# Patient Record
Sex: Female | Born: 1980
Health system: Southern US, Community
[De-identification: ages and names within clinical notes are randomized; demographics above are authoritative.]

## PROBLEM LIST (undated history)

## (undated) DIAGNOSIS — K219 Gastro-esophageal reflux disease without esophagitis: Secondary | ICD-10-CM

## (undated) DIAGNOSIS — E785 Hyperlipidemia, unspecified: Secondary | ICD-10-CM

## (undated) HISTORY — DX: Hyperlipidemia, unspecified: E78.5

---

## 1998-07-18 ENCOUNTER — Ambulatory Visit (HOSPITAL_COMMUNITY): Admission: RE | Admit: 1998-07-18 | Discharge: 1998-07-18 | Payer: Self-pay | Admitting: *Deleted

## 1998-09-13 ENCOUNTER — Ambulatory Visit (HOSPITAL_COMMUNITY): Admission: RE | Admit: 1998-09-13 | Discharge: 1998-09-13 | Payer: Self-pay | Admitting: *Deleted

## 1998-09-13 ENCOUNTER — Encounter: Payer: Self-pay | Admitting: *Deleted

## 1998-09-20 ENCOUNTER — Inpatient Hospital Stay (HOSPITAL_COMMUNITY): Admission: AD | Admit: 1998-09-20 | Discharge: 1998-09-20 | Payer: Self-pay | Admitting: *Deleted

## 1998-12-30 ENCOUNTER — Inpatient Hospital Stay (HOSPITAL_COMMUNITY): Admission: AD | Admit: 1998-12-30 | Discharge: 1999-01-01 | Payer: Self-pay | Admitting: *Deleted

## 1999-02-08 ENCOUNTER — Inpatient Hospital Stay (HOSPITAL_COMMUNITY): Admission: AD | Admit: 1999-02-08 | Discharge: 1999-02-08 | Payer: Self-pay | Admitting: *Deleted

## 1999-04-30 ENCOUNTER — Emergency Department (HOSPITAL_COMMUNITY): Admission: EM | Admit: 1999-04-30 | Discharge: 1999-04-30 | Payer: Self-pay | Admitting: Endocrinology

## 1999-04-30 ENCOUNTER — Encounter: Payer: Self-pay | Admitting: Endocrinology

## 1999-08-25 ENCOUNTER — Emergency Department (HOSPITAL_COMMUNITY): Admission: EM | Admit: 1999-08-25 | Discharge: 1999-08-25 | Payer: Self-pay | Admitting: Emergency Medicine

## 1999-10-28 ENCOUNTER — Inpatient Hospital Stay (HOSPITAL_COMMUNITY): Admission: AD | Admit: 1999-10-28 | Discharge: 1999-10-28 | Payer: Self-pay | Admitting: *Deleted

## 1999-11-08 ENCOUNTER — Emergency Department (HOSPITAL_COMMUNITY): Admission: EM | Admit: 1999-11-08 | Discharge: 1999-11-08 | Payer: Self-pay | Admitting: Emergency Medicine

## 2000-02-12 ENCOUNTER — Emergency Department (HOSPITAL_COMMUNITY): Admission: EM | Admit: 2000-02-12 | Discharge: 2000-02-12 | Payer: Self-pay | Admitting: *Deleted

## 2000-04-06 ENCOUNTER — Emergency Department (HOSPITAL_COMMUNITY): Admission: EM | Admit: 2000-04-06 | Discharge: 2000-04-06 | Payer: Self-pay

## 2000-04-13 ENCOUNTER — Encounter: Payer: Self-pay | Admitting: Emergency Medicine

## 2000-04-13 ENCOUNTER — Emergency Department (HOSPITAL_COMMUNITY): Admission: EM | Admit: 2000-04-13 | Discharge: 2000-04-13 | Payer: Self-pay | Admitting: Emergency Medicine

## 2000-07-09 ENCOUNTER — Inpatient Hospital Stay (HOSPITAL_COMMUNITY): Admission: AD | Admit: 2000-07-09 | Discharge: 2000-07-09 | Payer: Self-pay | Admitting: *Deleted

## 2000-08-15 ENCOUNTER — Inpatient Hospital Stay (HOSPITAL_COMMUNITY): Admission: AD | Admit: 2000-08-15 | Discharge: 2000-08-15 | Payer: Self-pay | Admitting: *Deleted

## 2000-10-04 ENCOUNTER — Emergency Department (HOSPITAL_COMMUNITY): Admission: EM | Admit: 2000-10-04 | Discharge: 2000-10-04 | Payer: Self-pay | Admitting: Emergency Medicine

## 2000-10-04 ENCOUNTER — Encounter: Payer: Self-pay | Admitting: Emergency Medicine

## 2000-10-04 ENCOUNTER — Encounter: Payer: Self-pay | Admitting: *Deleted

## 2000-12-04 ENCOUNTER — Encounter: Payer: Self-pay | Admitting: Emergency Medicine

## 2000-12-04 ENCOUNTER — Emergency Department (HOSPITAL_COMMUNITY): Admission: EM | Admit: 2000-12-04 | Discharge: 2000-12-04 | Payer: Self-pay | Admitting: Emergency Medicine

## 2001-03-16 ENCOUNTER — Inpatient Hospital Stay (HOSPITAL_COMMUNITY): Admission: AD | Admit: 2001-03-16 | Discharge: 2001-03-16 | Payer: Self-pay | Admitting: *Deleted

## 2001-10-27 ENCOUNTER — Encounter: Admission: RE | Admit: 2001-10-27 | Discharge: 2001-10-27 | Payer: Self-pay | Admitting: Family Medicine

## 2001-12-13 ENCOUNTER — Inpatient Hospital Stay (HOSPITAL_COMMUNITY): Admission: AD | Admit: 2001-12-13 | Discharge: 2001-12-13 | Payer: Self-pay | Admitting: *Deleted

## 2001-12-15 ENCOUNTER — Encounter: Admission: RE | Admit: 2001-12-15 | Discharge: 2001-12-15 | Payer: Self-pay | Admitting: Family Medicine

## 2002-03-03 ENCOUNTER — Other Ambulatory Visit: Admission: RE | Admit: 2002-03-03 | Discharge: 2002-03-03 | Payer: Self-pay

## 2002-03-03 ENCOUNTER — Encounter: Admission: RE | Admit: 2002-03-03 | Discharge: 2002-03-03 | Payer: Self-pay | Admitting: Family Medicine

## 2002-08-27 ENCOUNTER — Emergency Department (HOSPITAL_COMMUNITY): Admission: EM | Admit: 2002-08-27 | Discharge: 2002-08-27 | Payer: Self-pay | Admitting: Emergency Medicine

## 2002-10-08 ENCOUNTER — Inpatient Hospital Stay (HOSPITAL_COMMUNITY): Admission: AD | Admit: 2002-10-08 | Discharge: 2002-10-08 | Payer: Self-pay | Admitting: Family Medicine

## 2003-03-13 ENCOUNTER — Encounter: Payer: Self-pay | Admitting: Emergency Medicine

## 2003-03-13 ENCOUNTER — Emergency Department (HOSPITAL_COMMUNITY): Admission: EM | Admit: 2003-03-13 | Discharge: 2003-03-13 | Payer: Self-pay | Admitting: Emergency Medicine

## 2003-07-15 ENCOUNTER — Encounter: Admission: RE | Admit: 2003-07-15 | Discharge: 2003-07-15 | Payer: Self-pay | Admitting: Sports Medicine

## 2003-07-19 ENCOUNTER — Emergency Department (HOSPITAL_COMMUNITY): Admission: EM | Admit: 2003-07-19 | Discharge: 2003-07-20 | Payer: Self-pay | Admitting: Emergency Medicine

## 2003-08-04 ENCOUNTER — Encounter: Admission: RE | Admit: 2003-08-04 | Discharge: 2003-08-04 | Payer: Self-pay | Admitting: Family Medicine

## 2003-08-04 ENCOUNTER — Other Ambulatory Visit: Admission: RE | Admit: 2003-08-04 | Discharge: 2003-08-04 | Payer: Self-pay | Admitting: Family Medicine

## 2003-08-12 ENCOUNTER — Encounter: Admission: RE | Admit: 2003-08-12 | Discharge: 2003-08-12 | Payer: Self-pay | Admitting: Family Medicine

## 2003-09-07 ENCOUNTER — Encounter: Admission: RE | Admit: 2003-09-07 | Discharge: 2003-09-07 | Payer: Self-pay | Admitting: Family Medicine

## 2003-10-05 ENCOUNTER — Ambulatory Visit (HOSPITAL_COMMUNITY): Admission: RE | Admit: 2003-10-05 | Discharge: 2003-10-05 | Payer: Self-pay | Admitting: Family Medicine

## 2003-10-29 ENCOUNTER — Encounter: Admission: RE | Admit: 2003-10-29 | Discharge: 2003-10-29 | Payer: Self-pay | Admitting: Family Medicine

## 2003-11-29 ENCOUNTER — Encounter: Admission: RE | Admit: 2003-11-29 | Discharge: 2003-11-29 | Payer: Self-pay | Admitting: Family Medicine

## 2003-12-13 ENCOUNTER — Encounter: Admission: RE | Admit: 2003-12-13 | Discharge: 2003-12-13 | Payer: Self-pay | Admitting: Family Medicine

## 2003-12-31 ENCOUNTER — Encounter: Admission: RE | Admit: 2003-12-31 | Discharge: 2003-12-31 | Payer: Self-pay | Admitting: Sports Medicine

## 2004-01-12 ENCOUNTER — Encounter: Admission: RE | Admit: 2004-01-12 | Discharge: 2004-01-12 | Payer: Self-pay | Admitting: Family Medicine

## 2004-01-14 ENCOUNTER — Encounter: Admission: RE | Admit: 2004-01-14 | Discharge: 2004-01-14 | Payer: Self-pay | Admitting: Family Medicine

## 2004-01-28 ENCOUNTER — Encounter: Admission: RE | Admit: 2004-01-28 | Discharge: 2004-01-28 | Payer: Self-pay | Admitting: Family Medicine

## 2004-02-06 ENCOUNTER — Inpatient Hospital Stay (HOSPITAL_COMMUNITY): Admission: AD | Admit: 2004-02-06 | Discharge: 2004-02-06 | Payer: Self-pay | Admitting: *Deleted

## 2004-02-09 ENCOUNTER — Encounter: Admission: RE | Admit: 2004-02-09 | Discharge: 2004-02-09 | Payer: Self-pay | Admitting: Family Medicine

## 2004-02-15 ENCOUNTER — Encounter: Admission: RE | Admit: 2004-02-15 | Discharge: 2004-02-15 | Payer: Self-pay | Admitting: Family Medicine

## 2004-02-18 ENCOUNTER — Encounter: Admission: RE | Admit: 2004-02-18 | Discharge: 2004-02-18 | Payer: Self-pay | Admitting: Family Medicine

## 2004-02-23 ENCOUNTER — Encounter: Admission: RE | Admit: 2004-02-23 | Discharge: 2004-02-23 | Payer: Self-pay | Admitting: Family Medicine

## 2004-03-01 ENCOUNTER — Encounter: Admission: RE | Admit: 2004-03-01 | Discharge: 2004-03-01 | Payer: Self-pay | Admitting: Family Medicine

## 2004-03-07 ENCOUNTER — Inpatient Hospital Stay (HOSPITAL_COMMUNITY): Admission: AD | Admit: 2004-03-07 | Discharge: 2004-03-07 | Payer: Self-pay | Admitting: *Deleted

## 2004-03-08 ENCOUNTER — Encounter: Admission: RE | Admit: 2004-03-08 | Discharge: 2004-03-08 | Payer: Self-pay | Admitting: Family Medicine

## 2004-03-10 ENCOUNTER — Encounter: Admission: RE | Admit: 2004-03-10 | Discharge: 2004-03-10 | Payer: Self-pay | Admitting: *Deleted

## 2004-03-11 ENCOUNTER — Inpatient Hospital Stay (HOSPITAL_COMMUNITY): Admission: AD | Admit: 2004-03-11 | Discharge: 2004-03-13 | Payer: Self-pay | Admitting: Obstetrics and Gynecology

## 2004-04-10 ENCOUNTER — Encounter: Admission: RE | Admit: 2004-04-10 | Discharge: 2004-04-10 | Payer: Self-pay | Admitting: Family Medicine

## 2004-04-21 ENCOUNTER — Encounter: Admission: RE | Admit: 2004-04-21 | Discharge: 2004-04-21 | Payer: Self-pay | Admitting: Family Medicine

## 2004-05-18 ENCOUNTER — Ambulatory Visit: Payer: Self-pay | Admitting: Family Medicine

## 2004-07-12 ENCOUNTER — Ambulatory Visit: Payer: Self-pay | Admitting: Family Medicine

## 2004-09-25 ENCOUNTER — Ambulatory Visit: Payer: Self-pay | Admitting: Sports Medicine

## 2004-12-04 ENCOUNTER — Ambulatory Visit: Payer: Self-pay | Admitting: Sports Medicine

## 2004-12-04 ENCOUNTER — Other Ambulatory Visit: Admission: RE | Admit: 2004-12-04 | Discharge: 2004-12-04 | Payer: Self-pay | Admitting: Family Medicine

## 2004-12-07 ENCOUNTER — Encounter (INDEPENDENT_AMBULATORY_CARE_PROVIDER_SITE_OTHER): Payer: Self-pay | Admitting: *Deleted

## 2004-12-15 ENCOUNTER — Ambulatory Visit: Payer: Self-pay | Admitting: Family Medicine

## 2005-01-30 ENCOUNTER — Emergency Department (HOSPITAL_COMMUNITY): Admission: EM | Admit: 2005-01-30 | Discharge: 2005-01-30 | Payer: Self-pay | Admitting: Emergency Medicine

## 2005-09-19 ENCOUNTER — Other Ambulatory Visit: Admission: RE | Admit: 2005-09-19 | Discharge: 2005-09-19 | Payer: Self-pay | Admitting: Gynecology

## 2005-12-26 ENCOUNTER — Encounter: Admission: RE | Admit: 2005-12-26 | Discharge: 2005-12-26 | Payer: Self-pay | Admitting: Nephrology

## 2005-12-27 ENCOUNTER — Emergency Department (HOSPITAL_COMMUNITY): Admission: EM | Admit: 2005-12-27 | Discharge: 2005-12-27 | Payer: Self-pay | Admitting: Emergency Medicine

## 2006-03-11 ENCOUNTER — Other Ambulatory Visit: Admission: RE | Admit: 2006-03-11 | Discharge: 2006-03-11 | Payer: Self-pay | Admitting: Gynecology

## 2006-10-07 ENCOUNTER — Other Ambulatory Visit: Admission: RE | Admit: 2006-10-07 | Discharge: 2006-10-07 | Payer: Self-pay | Admitting: Gynecology

## 2006-10-31 DIAGNOSIS — K219 Gastro-esophageal reflux disease without esophagitis: Secondary | ICD-10-CM | POA: Insufficient documentation

## 2006-10-31 DIAGNOSIS — E669 Obesity, unspecified: Secondary | ICD-10-CM | POA: Insufficient documentation

## 2006-11-01 ENCOUNTER — Encounter (INDEPENDENT_AMBULATORY_CARE_PROVIDER_SITE_OTHER): Payer: Self-pay | Admitting: *Deleted

## 2006-12-04 ENCOUNTER — Other Ambulatory Visit: Admission: RE | Admit: 2006-12-04 | Discharge: 2006-12-04 | Payer: Self-pay | Admitting: Gynecology

## 2007-06-10 ENCOUNTER — Other Ambulatory Visit: Admission: RE | Admit: 2007-06-10 | Discharge: 2007-06-10 | Payer: Self-pay | Admitting: Gynecology

## 2007-06-16 ENCOUNTER — Ambulatory Visit: Payer: Self-pay | Admitting: Hematology & Oncology

## 2007-07-04 LAB — CBC WITH DIFFERENTIAL/PLATELET
Eosinophils Absolute: 0.1 10*3/uL (ref 0.0–0.5)
HCT: 38.5 % (ref 34.8–46.6)
LYMPH%: 20.9 % (ref 14.0–48.0)
MCH: 26.9 pg (ref 26.0–34.0)
MCHC: 33.7 g/dL (ref 32.0–36.0)
MONO%: 5.4 % (ref 0.0–13.0)
NEUT#: 8.5 10*3/uL — ABNORMAL HIGH (ref 1.5–6.5)
NEUT%: 72.6 % (ref 39.6–76.8)

## 2007-07-04 LAB — CHCC SMEAR

## 2007-07-08 LAB — BETA 2 MICROGLOBULIN, SERUM: Beta-2 Microglobulin: 1.25 mg/L (ref 1.01–1.73)

## 2007-07-08 LAB — SPEP & IFE WITH QIG
Alpha-2-Globulin: 10.9 % (ref 7.1–11.8)
Beta 2: 4.9 % (ref 3.2–6.5)
Beta Globulin: 5.8 % (ref 4.7–7.2)
Gamma Globulin: 22.1 % — ABNORMAL HIGH (ref 11.1–18.8)
IgA: 181 mg/dL (ref 68–378)

## 2007-07-31 ENCOUNTER — Inpatient Hospital Stay (HOSPITAL_COMMUNITY): Admission: EM | Admit: 2007-07-31 | Discharge: 2007-08-08 | Payer: Self-pay | Admitting: Emergency Medicine

## 2007-07-31 ENCOUNTER — Ambulatory Visit: Payer: Self-pay | Admitting: Pulmonary Disease

## 2007-08-15 ENCOUNTER — Encounter: Admission: RE | Admit: 2007-08-15 | Discharge: 2007-08-15 | Payer: Self-pay | Admitting: Family Medicine

## 2007-08-20 ENCOUNTER — Encounter: Admission: RE | Admit: 2007-08-20 | Discharge: 2007-08-20 | Payer: Self-pay | Admitting: Family Medicine

## 2007-10-03 ENCOUNTER — Encounter: Admission: RE | Admit: 2007-10-03 | Discharge: 2007-10-03 | Payer: Self-pay | Admitting: Family Medicine

## 2008-04-29 ENCOUNTER — Emergency Department (HOSPITAL_COMMUNITY): Admission: EM | Admit: 2008-04-29 | Discharge: 2008-04-29 | Payer: Self-pay | Admitting: Family Medicine

## 2010-05-09 ENCOUNTER — Emergency Department (HOSPITAL_COMMUNITY): Admission: EM | Admit: 2010-05-09 | Discharge: 2010-05-09 | Payer: Self-pay | Admitting: Emergency Medicine

## 2010-09-24 ENCOUNTER — Encounter: Payer: Self-pay | Admitting: Family Medicine
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
## 2011-06-05 ENCOUNTER — Other Ambulatory Visit: Payer: Self-pay | Admitting: Gynecology

## 2011-06-11 LAB — BASIC METABOLIC PANEL
CO2: 26
Calcium: 8.5
Chloride: 103
Chloride: 105
Creatinine, Ser: 0.55
Creatinine, Ser: 0.72
GFR calc Af Amer: >60
GFR calc Af Amer: >60
GFR calc non Af Amer: >60
Glucose, Bld: 97
Potassium: 3.4 — ABNORMAL LOW
Sodium: 134 — ABNORMAL LOW

## 2011-06-11 LAB — CBC
HCT: 28.6 — ABNORMAL LOW
HCT: 30.7 — ABNORMAL LOW
Hemoglobin: 10.4 — ABNORMAL LOW
Hemoglobin: 9.6 — ABNORMAL LOW
MCHC: 33.6
MCHC: 33.8
MCV: 80.3
RBC: 3.85 — ABNORMAL LOW
RBC: 3.91
WBC: 12.2 — ABNORMAL HIGH
WBC: 19.2 — ABNORMAL HIGH

## 2011-06-11 LAB — CLOSTRIDIUM DIFFICILE EIA

## 2011-06-12 LAB — CBC
HCT: 29.4 — ABNORMAL LOW
HCT: 32.6 — ABNORMAL LOW
HCT: 39.5
Hemoglobin: 10.4 — ABNORMAL LOW
Hemoglobin: 11 — ABNORMAL LOW
Hemoglobin: 9.7 — ABNORMAL LOW
MCHC: 33.1
MCHC: 33.5
MCV: 80.6
MCV: 80.9
MCV: 81
Platelets: 317
RBC: 3.63 — ABNORMAL LOW
RBC: 3.84 — ABNORMAL LOW
RBC: 4.1
RBC: 4.89
WBC: 18.4 — ABNORMAL HIGH
WBC: 21.5 — ABNORMAL HIGH

## 2011-06-12 LAB — DIFFERENTIAL
Basophils Relative: 0
Basophils Relative: 0
Eosinophils Absolute: 0 — ABNORMAL LOW
Eosinophils Relative: 0
Eosinophils Relative: 0
Lymphocytes Relative: 5 — ABNORMAL LOW
Lymphs Abs: 1
Lymphs Abs: 1.2
Monocytes Absolute: 0.6
Monocytes Absolute: 0.7
Monocytes Relative: 3
Monocytes Relative: 3
Neutro Abs: 17.6 — ABNORMAL HIGH

## 2011-06-12 LAB — BASIC METABOLIC PANEL
CO2: 24
Calcium: 8.5
Chloride: 103
Chloride: 103
Creatinine, Ser: 0.68
GFR calc Af Amer: >60
GFR calc Af Amer: >60
GFR calc Af Amer: >60
GFR calc non Af Amer: >60
Glucose, Bld: 107 — ABNORMAL HIGH
Glucose, Bld: 111 — ABNORMAL HIGH
Potassium: 3.2 — ABNORMAL LOW
Potassium: 3.7
Sodium: 133 — ABNORMAL LOW
Sodium: 134 — ABNORMAL LOW
Sodium: 137

## 2011-06-12 LAB — BLOOD GAS, ARTERIAL
Acid-base deficit: 2.1 — ABNORMAL HIGH
Bicarbonate: 21.1
Bicarbonate: 21.8
Bicarbonate: 24.1 — ABNORMAL HIGH
O2 Saturation: 89.5
TCO2: 19
pCO2 arterial: 32.5 — ABNORMAL LOW
pCO2 arterial: 37.5
pCO2 arterial: 39
pH, Arterial: 7.416 — ABNORMAL HIGH
pH, Arterial: 7.425 — ABNORMAL HIGH
pO2, Arterial: 59.8 — ABNORMAL LOW
pO2, Arterial: 89.3

## 2011-06-12 LAB — CULTURE, BLOOD (ROUTINE X 2)

## 2011-06-12 LAB — HEPATIC FUNCTION PANEL
ALT: 30
Alkaline Phosphatase: 72
Bilirubin, Direct: 0.1
Indirect Bilirubin: 0.7

## 2011-06-12 LAB — CK TOTAL AND CKMB (NOT AT ARMC)
CK, MB: 6.4 — ABNORMAL HIGH
Total CK: 327 — ABNORMAL HIGH

## 2011-06-12 LAB — POCT PREGNANCY, URINE: Preg Test, Ur: NEGATIVE

## 2011-06-12 LAB — CK: Total CK: 340 — ABNORMAL HIGH

## 2011-06-12 LAB — HEPATITIS PANEL, ACUTE
HCV Ab: NEGATIVE
Hep B C IgM: NEGATIVE
Hepatitis B Surface Ag: NEGATIVE

## 2011-06-12 LAB — D-DIMER, QUANTITATIVE: D-Dimer, Quant: 3.38 — ABNORMAL HIGH

## 2011-06-12 LAB — PROTIME-INR: Prothrombin Time: 14.3

## 2011-06-12 LAB — LACTATE DEHYDROGENASE: LDH: 241

## 2011-06-12 LAB — RAPID URINE DRUG SCREEN, HOSP PERFORMED
Opiates: POSITIVE — AB
Tetrahydrocannabinol: NOT DETECTED

## 2012-08-14 ENCOUNTER — Other Ambulatory Visit: Payer: Self-pay | Admitting: Gynecology

## 2012-10-25 ENCOUNTER — Emergency Department (HOSPITAL_COMMUNITY): Payer: BC Managed Care – PPO

## 2012-10-25 ENCOUNTER — Encounter (HOSPITAL_COMMUNITY): Payer: Self-pay

## 2012-10-25 ENCOUNTER — Emergency Department (HOSPITAL_COMMUNITY)
Admission: EM | Admit: 2012-10-25 | Discharge: 2012-10-25 | Disposition: A | Discharge status: Home / Self Care | Payer: BC Managed Care – PPO | Attending: Emergency Medicine | Admitting: Emergency Medicine

## 2012-10-25 DIAGNOSIS — K219 Gastro-esophageal reflux disease without esophagitis: Secondary | ICD-10-CM | POA: Insufficient documentation

## 2012-10-25 DIAGNOSIS — Z79899 Other long term (current) drug therapy: Secondary | ICD-10-CM | POA: Insufficient documentation

## 2012-10-25 DIAGNOSIS — R079 Chest pain, unspecified: Secondary | ICD-10-CM

## 2012-10-25 HISTORY — DX: Gastro-esophageal reflux disease without esophagitis: K21.9

## 2012-10-25 NOTE — ED Provider Notes (Signed)
History     CSN: [id number]  Arrival date & time 10/25/12  0804   First MD Initiated Contact with Patient 10/25/12 [phone]      Chief Complaint  Patient presents with  . Chest Pain    (Consider location/radiation/quality/duration/timing/severity/associated sxs/prior treatment) HPI Comments: Sheryl Garcia is a 32 y.o. Female who states that she had chest pain that started yesterday. It is persistent. It is dull. It waxes and wanes. She has not taken anything for this. She had trouble sleeping because of the pain. She has recently started a running program. She has not left weights. She works as a Engineer, site. She denies fever, chills, cough, shortness of breath, back, pain, weakness, or dizziness. There are no known modifying factors.  Patient is a 32 y.o. female presenting with chest pain. The history is provided by the patient.  Chest Pain   Past Medical History  Diagnosis Date  . GERD (gastroesophageal reflux disease)     History reviewed. No pertinent past surgical history.  No family history on file.  History  Substance Use Topics  . Smoking status: Never Smoker   . Smokeless tobacco: Not on file  . Alcohol Use: No    OB History   Grav Para Term Preterm Abortions TAB SAB Ect Mult Living                  Review of Systems  Cardiovascular: Positive for chest pain.  All other systems reviewed and are negative.    Allergies  Review of patient's allergies indicates no known allergies.  Home Medications   Current Outpatient Rx  Name  Route  Sig  Dispense  Refill  . acetaminophen (TYLENOL) 500 MG tablet   Oral   Take 500 mg by mouth every 6 (six) hours as needed for pain.         Marland Kitchen esomeprazole (NEXIUM) 40 MG capsule   Oral   Take 40 mg by mouth daily before breakfast.         . ibuprofen (ADVIL,MOTRIN) 200 MG tablet   Oral   Take 800 mg by mouth every 6 (six) hours as needed for pain.           BP 121/66  Temp(Src) 98.1 F (36.7 C)  (Oral)  Resp 18  SpO2 100%  Physical Exam  Nursing note and vitals reviewed. Constitutional: She is oriented to person, place, and time. She appears well-developed and well-nourished.  HENT:  Head: Normocephalic and atraumatic.  Eyes: Conjunctivae and EOM are normal. Pupils are equal, round, and reactive to light.  Neck: Normal range of motion and phonation normal. Neck supple.  Cardiovascular: Normal rate, regular rhythm and intact distal pulses.   Pulmonary/Chest: Effort normal and breath sounds normal. She exhibits tenderness (Left chest wall, upper, mild).  Abdominal: Soft. She exhibits no distension. There is no tenderness. There is no guarding.  Musculoskeletal: Normal range of motion.  Neurological: She is alert and oriented to person, place, and time. She has normal strength. She exhibits normal muscle tone.  Skin: Skin is warm and dry.  Psychiatric: She has a normal mood and affect. Her behavior is normal. Judgment and thought content normal.    ED Course  Procedures (including critical care time)     Date: 06/20/2012  Rate: 64  Rhythm: normal sinus rhythm  QRS Axis: normal  PR and QT Intervals: normal  ST/T Wave abnormalities: normal  PR and QRS Conduction Disutrbances:none  Narrative Interpretation:   Old  EKG Reviewed: unchanged   Labs Reviewed - No data to display Dg Chest 2 View  10/25/2012  *RADIOLOGY REPORT*  Clinical Data: Chest pain.  CHEST - 2 VIEW  Comparison: 10/03/2007  Findings: Two views of the chest demonstrate clear lungs. Heart and mediastinum are within normal limits.  Trachea is midline.  Bony thorax is intact.  IMPRESSION: No acute chest findings.   Original Report Authenticated By: Richarda Overlie, M.D.     Nursing Notes Reviewed/ Care Coordinated Applicable Imaging Reviewed Interpretation of Laboratory Data incorporated into ED treatment   1. Nonspecific chest pain       MDM  Nonspecific chest pain, with chest wall, tenderness. Doubt ACS,  PE (PERC negative), pneumonia. She is stable for discharge   Plan: Home Medications- IBU; Home Treatments- Heat; Recommended follow up- PCP prn   Flint Melter, MD 10/25/12 [phone]

## 2012-10-25 NOTE — ED Notes (Signed)
IV attempt x 1 unsuccessful. ?

## 2012-10-25 NOTE — ED Notes (Signed)
Dr. Wentz at the bedside.  

## 2012-10-25 NOTE — ED Notes (Signed)
Pt states she started having left sided cp last night while getting something to drink and it moves into her neck and under her left arm. Denies any other symptoms with the pain. No hx of HTN or DM.

## 2014-02-15 ENCOUNTER — Other Ambulatory Visit (HOSPITAL_COMMUNITY): Payer: Self-pay | Admitting: Obstetrics and Gynecology

## 2014-02-15 DIAGNOSIS — N971 Female infertility of tubal origin: Secondary | ICD-10-CM

## 2014-02-19 ENCOUNTER — Ambulatory Visit (HOSPITAL_COMMUNITY): Payer: BC Managed Care – PPO

## 2014-02-26 ENCOUNTER — Ambulatory Visit (HOSPITAL_COMMUNITY)
Admission: RE | Admit: 2014-02-26 | Discharge: 2014-02-26 | Disposition: A | Discharge status: Home / Self Care | Payer: BC Managed Care – PPO | Source: Ambulatory Visit | Attending: Obstetrics and Gynecology | Admitting: Obstetrics and Gynecology

## 2014-02-26 DIAGNOSIS — Z3049 Encounter for surveillance of other contraceptives: Secondary | ICD-10-CM | POA: Insufficient documentation

## 2014-02-26 DIAGNOSIS — N971 Female infertility of tubal origin: Secondary | ICD-10-CM

## 2014-02-26 MED ORDER — IOHEXOL 300 MG/ML  SOLN
20.0000 mL | Freq: Once | INTRAMUSCULAR | Status: AC | PRN
  Administered 2014-02-26: 5 mL

## 2016-02-16 ENCOUNTER — Other Ambulatory Visit: Payer: Self-pay | Admitting: Orthopedic Surgery

## 2016-03-26 ENCOUNTER — Ambulatory Visit: Payer: 59 | Admitting: Dietician

## 2016-03-29 ENCOUNTER — Encounter (HOSPITAL_BASED_OUTPATIENT_CLINIC_OR_DEPARTMENT_OTHER): Payer: Self-pay

## 2016-03-29 ENCOUNTER — Ambulatory Visit (HOSPITAL_BASED_OUTPATIENT_CLINIC_OR_DEPARTMENT_OTHER): Admit: 2016-03-29 | Payer: 59 | Admitting: Orthopedic Surgery

## 2016-03-29 SURGERY — CARPAL TUNNEL RELEASE
Anesthesia: Choice | Laterality: Right

## 2017-02-02 ENCOUNTER — Encounter (HOSPITAL_COMMUNITY): Payer: Self-pay

## 2017-02-02 ENCOUNTER — Emergency Department (HOSPITAL_COMMUNITY)
Admission: EM | Admit: 2017-02-02 | Discharge: 2017-02-02 | Disposition: A | Discharge status: Home / Self Care | Payer: 59 | Attending: Emergency Medicine | Admitting: Emergency Medicine

## 2017-02-02 DIAGNOSIS — L02412 Cutaneous abscess of left axilla: Secondary | ICD-10-CM | POA: Insufficient documentation

## 2017-02-02 DIAGNOSIS — Z79899 Other long term (current) drug therapy: Secondary | ICD-10-CM | POA: Insufficient documentation

## 2017-02-02 DIAGNOSIS — M79602 Pain in left arm: Secondary | ICD-10-CM | POA: Diagnosis present

## 2017-02-02 DIAGNOSIS — L0291 Cutaneous abscess, unspecified: Secondary | ICD-10-CM

## 2017-02-02 MED ORDER — LIDOCAINE HCL 2 % IJ SOLN
20.0000 mL | Freq: Once | INTRAMUSCULAR | Status: AC
  Administered 2017-02-02: 400 mg
  Filled 2017-02-02: qty 20

## 2017-02-02 NOTE — Discharge Instructions (Signed)
Please read attached information. If you experience any new or worsening signs or symptoms please return to the emergency room for evaluation. Please follow-up with your primary care provider or specialist as discussed.  °

## 2017-02-02 NOTE — ED Provider Notes (Signed)
[facility]-EMERGENCY DEPT Provider Note   CSN: [id number] Arrival date & time: 02/02/17  [id number]  By signing my name below, I, Rosana Fretana Waskiewicz, attest that this documentation has been prepared under the direction and in the presence of Newell RubbermaidJeffrey Soundra Lampley, PA-C. Electronically Signed: Rosana Fretana Waskiewicz, ED Scribe. 02/02/17. 1:00 PM.  History   Chief Complaint Chief Complaint  Patient presents with  . Abscess   The history is provided by the patient. No language interpreter was used.  Abscess  Associated symptoms: no fever    HPI Comments: Sheryl Garcia is a 36 y.o. female who presents to the Emergency Department complaining of a moderate, gradually worsening area of pain and swelling to the left axilla onset 2 days ago. Per pt, she has a hx of similar symptoms on her thigh 1 year ago. Pt states pain is exacerbated with palpation and direct pressure. Pt has tried applying hot compresses over the area with minimal relief. No recent antibiotic exposure. No hx of MRSA or IV drug use. Pt denies fever, drainage from the area or any other complaints at this time.    Past Medical History:  Diagnosis Date  . GERD (gastroesophageal reflux disease)     Patient Active Problem List   Diagnosis Date Noted  . OBESITY, NOS 10/31/2006  . GASTROESOPHAGEAL REFLUX, NO ESOPHAGITIS 10/31/2006    History reviewed. No pertinent surgical history.  OB History    No data available       Home Medications    Prior to Admission medications   Medication Sig Start Date End Date Taking? Authorizing Provider  acetaminophen (TYLENOL) 500 MG tablet Take 500 mg by mouth every 6 (six) hours as needed for pain.    [provider]  esomeprazole (NEXIUM) 40 MG capsule Take 40 mg by mouth daily before breakfast.    [provider]  ibuprofen (ADVIL,MOTRIN) 200 MG tablet Take 800 mg by mouth every 6 (six) hours as needed for pain.    [provider]    Family History History reviewed. No pertinent  family history.  Social History Social History  Substance Use Topics  . Smoking status: Never Smoker  . Smokeless tobacco: Never Used  . Alcohol use No     Allergies   Patient has no known allergies.   Review of Systems Review of Systems  Constitutional: Negative for fever.  Skin: Positive for wound.  All other systems reviewed and are negative.    Physical Exam Updated Vital Signs BP 118/89 (BP Location: Left Arm)   Pulse 78   Temp 98.5 F (36.9 C) (Oral)   Resp 20   LMP 01/11/2017   SpO2 100%   Physical Exam  Constitutional: She is oriented to person, place, and time. She appears well-developed and well-nourished.  HENT:  Head: Normocephalic and atraumatic.  Cardiovascular: Normal rate.   Pulmonary/Chest: Effort normal.  Neurological: She is alert and oriented to person, place, and time.  Skin: Skin is warm and dry.  Area of induration under the left axilla with a postule. No surrounding cellulitis.   Psychiatric: She has a normal mood and affect.  Nursing note and vitals reviewed.    ED Treatments / Results  DIAGNOSTIC STUDIES: Oxygen Saturation is 100% on RA, normal by my interpretation.   Labs (all labs ordered are listed, but only abnormal results are displayed) Labs Reviewed - No data to display  EKG  EKG Interpretation None       Radiology No results found.  Procedures .Marland Kitchen.Incision  and Drainage Date/Time: 02/02/2017 2:43 PM Performed by: Curlene Dolphin, Kerri Kovacik Authorized by: Curlene Dolphin, Areana Kosanke   Consent:    Consent obtained:  Verbal   Consent given by:  Patient   Risks discussed:  Bleeding, infection and pain   Alternatives discussed:  No treatment Location:    Type:  Abscess   Location:  Upper extremity   Upper extremity location:  Arm   Arm location:  L upper arm (axilla) Pre-procedure details:    Skin preparation:  Betadine Anesthesia (see MAR for exact dosages):    Anesthesia method:  Local infiltration   Local anesthetic:   Lidocaine 2% w/o epi Procedure type:    Complexity:  Simple Procedure details:    Needle aspiration: no     Incision types:  Single straight   Incision depth:  Dermal   Scalpel blade:  11   Wound management:  Probed and deloculated and irrigated with saline   Drainage:  Purulent (2 cc)   Drainage amount:  Scant   Wound treatment:  Wound left open   Packing materials:  None Post-procedure details:    Patient tolerance of procedure:  Tolerated well, no immediate complications   EMERGENCY DEPARTMENT US SOFT TISSUE INTERPRETATION "Study: Limited Soft Tissue Ultrasound"  INDICATIONS: Soft tissue infection Multiple views of the body part were obtained in real-time with a multi-frequency linear probe  PERFORMED BY: Myself IMAGES ARCHIVED?: Yes SIDE:Left BODY PART:Axilla INTERPRETATION:  Abcess present   Medications Ordered in ED Medications  lidocaine (XYLOCAINE) 2 % (with pres) injection 400 mg (400 mg Infiltration Given 02/02/17 1335)     Initial Impression / Assessment and Plan / ED Course  I have reviewed the triage vital signs and the nursing notes.  Pertinent labs & imaging results that were available during my care of the patient were reviewed by me and considered in my medical decision making (see chart for details).      Final Clinical Impressions(s) / ED Diagnoses   Final diagnoses:  Abscess   Labs:   Imaging:   Consults:   Therapeutics: I&D  Discharge Meds:  Assessment/Plan: Small abscess to left axilla. I&D successfully here. No need for antibiotics or packing at this time. Primary care follow-up, strict return precautions given. Patient verbalized understanding and agreement to today's plan had no further questions concerns at time of discharge.   New Prescriptions Discharge Medication List as of 02/02/2017  3:18 PM    I personally performed the services described in this documentation, which was scribed in my presence. The recorded information has  been reviewed and is accurate.     Eyvonne Mechanic, PA-C 02/02/17 2028    Shaune Pollack, MD 02/03/17 [phone]

## 2017-02-02 NOTE — ED Triage Notes (Signed)
Pt with abscess in left axilla since Thursday.

## 2019-04-10 ENCOUNTER — Ambulatory Visit
Admission: RE | Admit: 2019-04-10 | Discharge: 2019-04-10 | Disposition: A | Discharge status: Home / Self Care | Payer: 59 | Source: Ambulatory Visit | Attending: Nephrology | Admitting: Nephrology

## 2019-04-10 ENCOUNTER — Other Ambulatory Visit: Payer: Self-pay | Admitting: Nephrology

## 2019-12-02 ENCOUNTER — Ambulatory Visit: Payer: Self-pay | Admitting: Nurse Practitioner

## 2019-12-10 ENCOUNTER — Other Ambulatory Visit: Payer: Self-pay

## 2019-12-10 ENCOUNTER — Ambulatory Visit: Payer: 59 | Admitting: Nurse Practitioner

## 2019-12-10 ENCOUNTER — Encounter: Payer: Self-pay | Admitting: Nurse Practitioner

## 2019-12-10 VITALS — BP 132/86 | HR 100 | Temp 98.2°F | Ht 65.0 in | Wt 297.8 lb

## 2019-12-10 DIAGNOSIS — Z6841 Body Mass Index (BMI) 40.0 and over, adult: Secondary | ICD-10-CM

## 2019-12-10 DIAGNOSIS — K219 Gastro-esophageal reflux disease without esophagitis: Secondary | ICD-10-CM

## 2019-12-10 DIAGNOSIS — R635 Abnormal weight gain: Secondary | ICD-10-CM | POA: Diagnosis not present

## 2019-12-10 MED ORDER — SAXENDA 18 MG/3ML ~~LOC~~ SOPN: 3.0000 mg | PEN_INJECTOR | Freq: Every day | SUBCUTANEOUS | 1 refills | Status: DC

## 2019-12-10 NOTE — Patient Instructions (Signed)
Goal to lose 5 lbs in 8 weeks  Goal to walk 20 minutes 3 days a week  Goal to drink 5 - 16 oz bottles of water a day  Look up [employer] with Phil on [employer]    Exercising to Lose Weight Exercise is structured, repetitive physical activity to improve fitness and health. Getting regular exercise is important for everyone. It is especially important if you are overweight. Being overweight increases your risk of heart disease, stroke, diabetes, high blood pressure, and several types of cancer. Reducing your calorie intake and exercising can help you lose weight. Exercise is usually categorized as moderate or vigorous intensity. To lose weight, most people need to do a certain amount of moderate-intensity or vigorous-intensity exercise each week. Moderate-intensity exercise  Moderate-intensity exercise is any activity that gets you moving enough to burn at least three times more energy (calories) than if you were sitting. Examples of moderate exercise include:  Walking a mile in 15 minutes.  Doing light yard work.  Biking at an easy pace. Most people should get at least 150 minutes (2 hours and 30 minutes) a week of moderate-intensity exercise to maintain their body weight. Vigorous-intensity exercise Vigorous-intensity exercise is any activity that gets you moving enough to burn at least six times more calories than if you were sitting. When you exercise at this intensity, you should be working hard enough that you are not able to carry on a conversation. Examples of vigorous exercise include:  Running.  Playing a team sport, such as football, basketball, and soccer.  Jumping rope. Most people should get at least 75 minutes (1 hour and 15 minutes) a week of vigorous-intensity exercise to maintain their body weight. How can exercise affect me? When you exercise enough to burn more calories than you eat, you lose weight. Exercise also reduces body fat and builds muscle. The more muscle  you have, the more calories you burn. Exercise also:  Improves mood.  Reduces stress and tension.  Improves your overall fitness, flexibility, and endurance.  Increases bone strength. The amount of exercise you need to lose weight depends on:  Your age.  The type of exercise.  Any health conditions you have.  Your overall physical ability. Talk to your health care provider about how much exercise you need and what types of activities are safe for you. What actions can I take to lose weight? Nutrition   Make changes to your diet as told by your health care provider or diet and nutrition specialist (dietitian). This may include: ? Eating fewer calories. ? Eating more protein. ? Eating less unhealthy fats. ? Eating a diet that includes fresh fruits and vegetables, whole grains, low-fat dairy products, and lean protein. ? Avoiding foods with added fat, salt, and sugar.  Drink plenty of water while you exercise to prevent dehydration or heat stroke. Activity  Choose an activity that you enjoy and set realistic goals. Your health care provider can help you make an exercise plan that works for you.  Exercise at a moderate or vigorous intensity most days of the week. ? The intensity of exercise may vary from person to person. You can tell how intense a workout is for you by paying attention to your breathing and heartbeat. Most people will notice their breathing and heartbeat get faster with more intense exercise.  Do resistance training twice each week, such as: ? Push-ups. ? Sit-ups. ? Lifting weights. ? Using resistance bands.  Getting short amounts of exercise  can be just as helpful as long structured periods of exercise. If you have trouble finding time to exercise, try to include exercise in your daily routine. ? Get up, stretch, and walk around every 30 minutes throughout the day. ? Go for a walk during your lunch break. ? Park your car farther away from your  destination. ? If you take public transportation, get off one stop early and walk the rest of the way. ? Make phone calls while standing up and walking around. ? Take the stairs instead of elevators or escalators.  Wear comfortable clothes and shoes with good support.  Do not exercise so much that you hurt yourself, feel dizzy, or get very short of breath. Where to find more information  U.S. Department of Health and Human Services: ThisPath.fi  Centers for Disease Control and Prevention (CDC): FootballExhibition.com.br Contact a health care provider:  Before starting a new exercise program.  If you have questions or concerns about your weight.  If you have a medical problem that keeps you from exercising. Get help right away if you have any of the following while exercising:  Injury.  Dizziness.  Difficulty breathing or shortness of breath that does not go away when you stop exercising.  Chest pain.  Rapid heartbeat. Summary  Being overweight increases your risk of heart disease, stroke, diabetes, high blood pressure, and several types of cancer.  Losing weight happens when you burn more calories than you eat.  Reducing the amount of calories you eat in addition to getting regular moderate or vigorous exercise each week helps you lose weight. This information is not intended to replace advice given to you by your health care provider. Make sure you discuss any questions you have with your health care provider. Document Revised: 09/02/2017 Document Reviewed: 09/02/2017 Elsevier Patient Education  2020 [employer].

## 2019-12-10 NOTE — Progress Notes (Signed)
This visit occurred during the SARS-CoV-2 public health emergency.  Safety protocols were in place, including screening questions prior to the visit, additional usage of staff PPE, and extensive cleaning of exam room while observing appropriate contact time as indicated for disinfecting solutions.  Subjective:     Patient ID: Sheryl Garcia , female    DOB: 1981/08/28 , 39 y.o.   MRN: [id number]   Chief Complaint  Patient presents with  . Establish Care    weight loss    HPI  Here to reestablish care she had been seeing Dr. Baird Cancer when she was here before.  Eve at [facility] seen last year.  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   PMH - prediabetes (no medications), GERD (starting to come back- used to take Nexium).    [facility] - mother and father - healthy.  4 brothers and one sister (hypertension).   Alcohol use - 4-6 oz a week Nonsmoker, no drug use.   She had her first covid vaccine by [facility] - sore arm and itching around the site  She feels like she has gained 40 lbs in the last year.  She has tried phentermine and Qsymia in the past. She had attempted Victoza but unsure if her insurance did not cover at the time. Goal short term to lose 20 lbs and long term 50 lbs.  She feels her barriers are consistency, feels like she is over eating, will feel bad after eating an increased amount of foods.  She does eat a lot of sugars. She has done intermittent fasting. She does not journal. She has been trying to increase her water intake - was getting 3 cups of water a day.  She cooks and eats out almost equally.  When she is busy with her daughter will pick up food out.  She has not done meal prep in the past.   Gastroesophageal Reflux She complains of heartburn. She reports no abdominal pain, no coughing, no dysphagia or no nausea. This is a new problem. The current episode started more than 1 year ago. The heartburn does not wake her from sleep. The  heartburn does not limit her activity. The heartburn doesn't change with position. Pertinent negatives include no anemia.     Past Medical History:  Diagnosis Date  . GERD (gastroesophageal reflux disease)      History reviewed. No pertinent family history.    Current Outpatient Medications:  .  acetaminophen (TYLENOL) 500 MG tablet, Take 500 mg by mouth every 6 (six) hours as needed for pain., Disp: , Rfl:    No Known Allergies   Review of Systems  Respiratory: Negative for cough.   Gastrointestinal: Positive for heartburn. Negative for abdominal pain, dysphagia and nausea.     Today's Vitals   12/10/19 1455  BP: 132/86  Pulse: 100  Temp: 98.2 F (36.8 C)  TempSrc: Oral  SpO2: 96%  Weight: 297 lb 12.8 oz (135.1 kg)  Height: 5' 5"  (1.651 m)   Body mass index is 49.56 kg/m.   Objective:  Physical Exam Vitals reviewed.  Constitutional:      Appearance: Normal appearance.  Cardiovascular:     Rate and Rhythm: Normal rate and regular rhythm.     Pulses: Normal pulses.     Heart sounds: Normal heart sounds. No murmur.  Pulmonary:     Effort: Pulmonary effort is normal. No respiratory distress.     Breath sounds: Normal breath sounds.  Neurological:  General: No focal deficit present.     Mental Status: She is alert and oriented to person, place, and time.     Cranial Nerves: No cranial nerve deficit.  Psychiatric:        Mood and Affect: Mood normal.        Behavior: Behavior normal.        Thought Content: Thought content normal.        Judgment: Judgment normal.         Assessment And Plan:     1. Class 3 severe obesity due to excess calories without serious comorbidity with body mass index (BMI) of 45.0 to 49.9 in adult (HCC) ASK- Given permission to discuss weight today ASSESS - Body mass index is 49.56 kg/m., well tolerated, WAIST CIRCUMFERENCE 54.4 cm ADVISE  - on risk of cardiovascular disease currently has a diagnosis of hypertension, on  medications. AGREE - to increase her physical activity of walking back to walking daily to include during lunch time ASSIST - given info on exercise programs, feels barriers have been related to just getting off track - Lipid panel - Hemoglobin A1c - CBC - Amb ref to Medical Nutrition Therapy-MNT  2. Gastroesophageal reflux disease without esophagitis  Encouraged to take a probiotic and to avoid fried and spicy foods - Lipid panel - CMP14+EGFR  3. Abnormal weight gain  Will check for metabolic causes  Encouraged to eat a diet low in sugar and carbohydrates - TSH - CBC - Amb ref to Medical Nutrition Therapy-MNT   Minette Brine, FNP    THE PATIENT IS ENCOURAGED TO PRACTICE SOCIAL DISTANCING DUE TO THE COVID-19 PANDEMIC.

## 2019-12-11 LAB — CBC
Hematocrit: 38.4 % (ref 34.0–46.6)
Hemoglobin: 11.9 g/dL (ref 11.1–15.9)
MCH: 25.8 pg — ABNORMAL LOW (ref 26.6–33.0)
MCHC: 31 g/dL — ABNORMAL LOW (ref 31.5–35.7)
MCV: 83 fL (ref 79–97)
Platelets: 394 10*3/uL (ref 150–450)
RBC: 4.61 x10E6/uL (ref 3.77–5.28)
RDW: 14.4 % (ref 11.7–15.4)
WBC: 10.8 10*3/uL (ref 3.4–10.8)

## 2019-12-11 LAB — CMP14+EGFR
ALT: 19 IU/L (ref 0–32)
AST: 22 IU/L (ref 0–40)
Albumin/Globulin Ratio: 1.2 (ref 1.2–2.2)
Albumin: 4 g/dL (ref 3.8–4.8)
Alkaline Phosphatase: 85 IU/L (ref 39–117)
BUN/Creatinine Ratio: 17 (ref 9–23)
BUN: 12 mg/dL (ref 6–24)
Bilirubin Total: <0.2 mg/dL (ref 0.0–1.2)
CO2: 19 mmol/L — ABNORMAL LOW (ref 20–29)
Calcium: 10.1 mg/dL (ref 8.7–10.2)
Chloride: 102 mmol/L (ref 96–106)
Creatinine, Ser: 0.7 mg/dL (ref 0.57–1.00)
GFR calc Af Amer: 125 mL/min/{1.73_m2} (ref >59)
GFR calc non Af Amer: 109 mL/min/{1.73_m2} (ref >59)
Globulin, Total: 3.3 g/dL (ref 1.5–4.5)
Glucose: 96 mg/dL (ref 65–99)
Potassium: 4 mmol/L (ref 3.5–5.2)
Sodium: 136 mmol/L (ref 134–144)
Total Protein: 7.3 g/dL (ref 6.0–8.5)

## 2019-12-11 LAB — HEMOGLOBIN A1C
Est. average glucose Bld gHb Est-mCnc: 123 mg/dL
Hgb A1c MFr Bld: 5.9 % — ABNORMAL HIGH (ref 4.8–5.6)

## 2019-12-11 LAB — LIPID PANEL
Chol/HDL Ratio: 4.3 ratio (ref 0.0–4.4)
Cholesterol, Total: 198 mg/dL (ref 100–199)
HDL: 46 mg/dL (ref >39)
LDL Chol Calc (NIH): 133 mg/dL — ABNORMAL HIGH (ref 0–99)
Triglycerides: 104 mg/dL (ref 0–149)
VLDL Cholesterol Cal: 19 mg/dL (ref 5–40)

## 2019-12-11 LAB — TSH: TSH: 1.41 u[IU]/mL (ref 0.450–4.500)

## 2019-12-15 ENCOUNTER — Other Ambulatory Visit: Payer: Self-pay | Admitting: Nurse Practitioner

## 2019-12-15 ENCOUNTER — Other Ambulatory Visit: Payer: Self-pay

## 2019-12-15 ENCOUNTER — Telehealth: Payer: Self-pay

## 2019-12-15 MED ORDER — PHENTERMINE HCL 15 MG PO CAPS: 15.0000 mg | ORAL_CAPSULE | ORAL | 1 refills | Status: DC

## 2019-12-15 NOTE — Telephone Encounter (Signed)
I called pt to advise her that we have samples of Restora here for pickup for her and also to let her know that we can try her on Metformin 500mg  BID since the saxenda is not covered by her ins. She doesn't want the metformin she stated she thinks her ins covers phentermine and Qysemia. Pt was advised that we will send in the phentermine and also let her know that that medication is not covered by her ins she may have to pay for it. Pt understood. YL,RMA

## 2019-12-25 ENCOUNTER — Encounter: Payer: Self-pay | Admitting: Nurse Practitioner

## 2019-12-28 ENCOUNTER — Other Ambulatory Visit: Payer: Self-pay | Admitting: Nurse Practitioner

## 2019-12-28 DIAGNOSIS — Z6841 Body Mass Index (BMI) 40.0 and over, adult: Secondary | ICD-10-CM

## 2019-12-28 MED ORDER — PHENTERMINE HCL 15 MG PO CAPS: 15.0000 mg | ORAL_CAPSULE | ORAL | 1 refills | Status: DC

## 2020-01-29 ENCOUNTER — Encounter: Payer: Self-pay | Admitting: Nurse Practitioner

## 2020-02-04 ENCOUNTER — Encounter: Payer: Self-pay | Admitting: Nurse Practitioner

## 2020-02-04 ENCOUNTER — Ambulatory Visit: Payer: 59 | Admitting: Nurse Practitioner

## 2020-02-04 ENCOUNTER — Other Ambulatory Visit: Payer: Self-pay

## 2020-02-04 VITALS — BP 150/88 | HR 94 | Temp 98.4°F | Ht 63.6 in | Wt 292.8 lb

## 2020-02-04 DIAGNOSIS — R030 Elevated blood-pressure reading, without diagnosis of hypertension: Secondary | ICD-10-CM

## 2020-02-04 DIAGNOSIS — Z6841 Body Mass Index (BMI) 40.0 and over, adult: Secondary | ICD-10-CM

## 2020-02-04 DIAGNOSIS — R7309 Other abnormal glucose: Secondary | ICD-10-CM | POA: Diagnosis not present

## 2020-02-04 MED ORDER — METFORMIN HCL 500 MG PO TABS: 500.0000 mg | ORAL_TABLET | Freq: Every day | ORAL | 1 refills | Status: DC

## 2020-02-04 MED ORDER — HYDROCHLOROTHIAZIDE 12.5 MG PO TABS: 12.5000 mg | ORAL_TABLET | Freq: Every day | ORAL | 2 refills | Status: DC

## 2020-02-04 MED ORDER — MAGNESIUM 200 MG PO TABS: ORAL_TABLET | ORAL | 3 refills | Status: DC

## 2020-02-04 NOTE — Progress Notes (Signed)
This visit occurred during the SARS-CoV-2 public health emergency.  Safety protocols were in place, including screening questions prior to the visit, additional usage of staff PPE, and extensive cleaning of exam room while observing appropriate contact time as indicated for disinfecting solutions.  Subjective:     Patient ID: Sheryl Garcia , female    DOB: 10-05-1980 , 39 y.o.   MRN: [id number]   Chief Complaint  Patient presents with  . Weight Check    HPI  Here for weight check  Wt Readings from Last 3 Encounters: 02/04/20 : 292 lb 12.8 oz (132.8 kg) 12/10/19 : 297 lb 12.8 oz (135.1 kg)  She is no longer taking phentermine due to her blood pressure increasing, her blood pressure was up to 170/100. She has not had sweets and eating more healthy. She is drinking more water (130 oz approximately). She has not started exercising yet but plans to incorporate.         Past Medical History:  Diagnosis Date  . GERD (gastroesophageal reflux disease)      History reviewed. No pertinent family history.    Current Outpatient Medications:  .  acetaminophen (TYLENOL) 500 MG tablet, Take 500 mg by mouth every 6 (six) hours as needed for pain., Disp: , Rfl:  .  hydrochlorothiazide (HYDRODIURIL) 12.5 MG tablet, Take 1 tablet (12.5 mg total) by mouth daily., Disp: 30 tablet, Rfl: 2 .  Magnesium 200 MG TABS, Take daily with evening meal, Disp: 30 tablet, Rfl: 3 .  metFORMIN (GLUCOPHAGE) 500 MG tablet, Take 1 tablet (500 mg total) by mouth daily with breakfast., Disp: 90 tablet, Rfl: 1 .  phentermine 15 MG capsule, Take 1 capsule (15 mg total) by mouth every morning. (Patient not taking: Reported on 02/04/2020), Disp: 30 capsule, Rfl: 1   No Known Allergies   Review of Systems  Constitutional: Negative.   Respiratory: Negative.   Cardiovascular: Negative.  Negative for chest pain and leg swelling.  Neurological: Negative for dizziness and headaches.  Psychiatric/Behavioral: Negative.       Today's Vitals   02/04/20 1620  BP: (!) 150/88  Pulse: 94  Temp: 98.4 F (36.9 C)  TempSrc: Oral  Weight: 292 lb 12.8 oz (132.8 kg)  Height: 5' 3.6" (1.615 m)  PainSc: 0-No pain   Body mass index is 50.89 kg/m.   Objective:  Physical Exam Vitals reviewed.  Constitutional:      Appearance: Normal appearance.  Cardiovascular:     Rate and Rhythm: Normal rate and regular rhythm.     Pulses: Normal pulses.     Heart sounds: Normal heart sounds. No murmur.  Pulmonary:     Effort: Pulmonary effort is normal. No respiratory distress.     Breath sounds: Normal breath sounds.  Neurological:     General: No focal deficit present.     Mental Status: She is alert and oriented to person, place, and time.     Cranial Nerves: No cranial nerve deficit.  Psychiatric:        Mood and Affect: Mood normal.        Behavior: Behavior normal.        Thought Content: Thought content normal.        Judgment: Judgment normal.         Assessment And Plan:     1. Class 3 severe obesity due to excess calories without serious comorbidity with body mass index (BMI) of 45.0 to 49.9 in adult [facility]) ASK- Given permission to  discuss weight today ASSESS - Body mass index is 50.89 kg/m., well tolerated, WAIST CIRCUMFERENCE  53.75 cm was 54.4. she has lost 4 lbs since her last visit.  ADVISE  - on risk of cardiovascular disease currently has a diagnosis of hypertension, on medications. Stopped taking phentermine due to an increase in her blood pressure.  AGREE - She is to increase her physical activity of walking back to walking daily ASSIST - given info on exercise programs, feels barriers have been related to just getting off track, she has been to the nutritionist at [facility] who did a breathing test.  - Magnesium 200 MG TABS; Take daily with evening meal  Dispense: 30 tablet; Refill: 3 - metFORMIN (GLUCOPHAGE) 500 MG tablet; Take 1 tablet (500 mg total) by mouth daily with breakfast.  Dispense:  90 tablet; Refill: 1 - Insulin, random  2. Abnormal glucose  She is to take metformin once a day  Advised to avoid sugary foods and drinks. - metFORMIN (GLUCOPHAGE) 500 MG tablet; Take 1 tablet (500 mg total) by mouth daily with breakfast.  Dispense: 90 tablet; Refill: 1 - Insulin, random  3. Elevated blood pressure reading without diagnosis of hypertension  This is new, started after she began taking phentermine which she stopped within a couple weeks.  She is to check her blood pressure daily and send a message in [place] with her readings, we may need to start a medication  Avoid high salt foods and drink adequate amounts of water.   Minette Brine, FNP    THE PATIENT IS ENCOURAGED TO PRACTICE SOCIAL DISTANCING DUE TO THE COVID-19 PANDEMIC.

## 2020-02-04 NOTE — Patient Instructions (Signed)
Take HCTZ for the next 3 days and check blood pressure send in Mychart on Mon or Tues

## 2020-02-05 LAB — INSULIN, RANDOM: INSULIN: 28.7 u[IU]/mL — ABNORMAL HIGH (ref 2.6–24.9)

## 2020-02-09 ENCOUNTER — Encounter: Payer: Self-pay | Admitting: Nurse Practitioner

## 2020-03-18 ENCOUNTER — Encounter (HOSPITAL_COMMUNITY): Payer: Self-pay | Admitting: Emergency Medicine

## 2020-03-18 ENCOUNTER — Emergency Department (HOSPITAL_COMMUNITY)
Admission: EM | Admit: 2020-03-18 | Discharge: 2020-03-18 | Disposition: A | Discharge status: Home / Self Care | Payer: 59 | Attending: Emergency Medicine | Admitting: Emergency Medicine

## 2020-03-18 DIAGNOSIS — R112 Nausea with vomiting, unspecified: Secondary | ICD-10-CM | POA: Diagnosis not present

## 2020-03-18 DIAGNOSIS — K5900 Constipation, unspecified: Secondary | ICD-10-CM | POA: Insufficient documentation

## 2020-03-18 DIAGNOSIS — Z7984 Long term (current) use of oral hypoglycemic drugs: Secondary | ICD-10-CM | POA: Diagnosis not present

## 2020-03-18 DIAGNOSIS — Z79899 Other long term (current) drug therapy: Secondary | ICD-10-CM | POA: Diagnosis not present

## 2020-03-18 DIAGNOSIS — R109 Unspecified abdominal pain: Secondary | ICD-10-CM | POA: Diagnosis present

## 2020-03-18 LAB — URINALYSIS, ROUTINE W REFLEX MICROSCOPIC
Bilirubin Urine: NEGATIVE
Glucose, UA: NEGATIVE mg/dL
Ketones, ur: NEGATIVE mg/dL
Leukocytes,Ua: NEGATIVE
Nitrite: NEGATIVE
Protein, ur: NEGATIVE mg/dL
Specific Gravity, Urine: 1.011 (ref 1.005–1.030)
pH: 6 (ref 5.0–8.0)

## 2020-03-18 LAB — CBC
HCT: 38.6 % (ref 36.0–46.0)
Hemoglobin: 11.6 g/dL — ABNORMAL LOW (ref 12.0–15.0)
MCH: 25.6 pg — ABNORMAL LOW (ref 26.0–34.0)
MCHC: 30.1 g/dL (ref 30.0–36.0)
MCV: 85 fL (ref 80.0–100.0)
Platelets: 444 10*3/uL — ABNORMAL HIGH (ref 150–400)
RBC: 4.54 MIL/uL (ref 3.87–5.11)
RDW: 15.2 % (ref 11.5–15.5)
WBC: 7.9 10*3/uL (ref 4.0–10.5)
nRBC: 0 % (ref 0.0–0.2)

## 2020-03-18 LAB — COMPREHENSIVE METABOLIC PANEL
ALT: 27 U/L (ref 0–44)
AST: 25 U/L (ref 15–41)
Albumin: 3.6 g/dL (ref 3.5–5.0)
Alkaline Phosphatase: 66 U/L (ref 38–126)
Anion gap: 10 (ref 5–15)
BUN: 13 mg/dL (ref 6–20)
CO2: 22 mmol/L (ref 22–32)
Calcium: 10.1 mg/dL (ref 8.9–10.3)
Chloride: 106 mmol/L (ref 98–111)
Creatinine, Ser: 0.83 mg/dL (ref 0.44–1.00)
GFR calc Af Amer: >60 mL/min (ref >60)
GFR calc non Af Amer: >60 mL/min (ref >60)
Glucose, Bld: 135 mg/dL — ABNORMAL HIGH (ref 70–99)
Potassium: 3.3 mmol/L — ABNORMAL LOW (ref 3.5–5.1)
Sodium: 138 mmol/L (ref 135–145)
Total Bilirubin: 0.4 mg/dL (ref 0.3–1.2)
Total Protein: 7.1 g/dL (ref 6.5–8.1)

## 2020-03-18 LAB — LIPASE, BLOOD: Lipase: 16 U/L (ref 11–51)

## 2020-03-18 LAB — I-STAT BETA HCG BLOOD, ED (MC, WL, AP ONLY): I-stat hCG, quantitative: <5 m[IU]/mL (ref <5)

## 2020-03-18 MED ORDER — SODIUM CHLORIDE 0.9% FLUSH: 3.0000 mL | Freq: Once | INTRAVENOUS | Status: DC

## 2020-03-18 MED ORDER — OXYCODONE-ACETAMINOPHEN 5-325 MG PO TABS: 1.0000 | ORAL_TABLET | Freq: Four times a day (QID) | ORAL | 0 refills | Status: DC | PRN

## 2020-03-18 MED ORDER — SENNOSIDES-DOCUSATE SODIUM 8.6-50 MG PO TABS: 1.0000 | ORAL_TABLET | Freq: Every day | ORAL | 0 refills | Status: AC

## 2020-03-18 MED ORDER — OXYCODONE-ACETAMINOPHEN 5-325 MG PO TABS
1.0000 | ORAL_TABLET | Freq: Once | ORAL | Status: AC
  Administered 2020-03-18: 1 via ORAL
  Filled 2020-03-18: qty 1

## 2020-03-18 MED ORDER — IBUPROFEN 600 MG PO TABS: 600.0000 mg | ORAL_TABLET | Freq: Four times a day (QID) | ORAL | 0 refills | Status: AC | PRN

## 2020-03-18 NOTE — Discharge Instructions (Signed)
From your history, your clinical exam, and your blood test, it is very likely that you are passing a kidney stone.  We discussed whether to get a CT scan today, you decided not to get the scan.  I think that is a reasonable plan.  As I said, 90% of kidney stones are small enough to pass on their own.  This can take 2-4 weeks on average.  Please drink lots of water at home.  I prescribed motrin 600 mg every 6 hours for pain - please take this for the next 7 days, or on an as-needed basis if you are having pain.  I also gave you 6 percocet tablets for break-through pain.  Your bloodwork and exam did not show signs of serious infection such as appendicitis.  However, if your symptoms are worsening at home, or if you have severe vomiting and cannot keep down fluids, you should return to the ER.  Your clinical exam and tests can change over time.

## 2020-03-18 NOTE — ED Triage Notes (Signed)
Pt reports LLQ pain with some L flank pain, onset this am. Also endorses nausea. Denies other urinary symptoms, diarrhea. Endorses some constipation, last BM Wednesday.

## 2020-03-18 NOTE — ED Notes (Signed)
Patient given discharge instructions. Questions were answered. Patient verbalized understanding of discharge instructions and care at home.  

## 2020-03-18 NOTE — ED Provider Notes (Signed)
[facility] EMERGENCY DEPARTMENT Provider Note   CSN: [id number] Arrival date & time: 03/18/20  0757     History Chief Complaint  Patient presents with  . Abdominal Pain  . Flank Pain    Sheryl Garcia is a 39 y.o. female with history of obesity presented to emergency department with acute onset left flank pain.  She says she woke up with this pain this morning.  She describes severe pain at its onset, associated with vomiting.  She tried to go to work with her pain was too intense.  She says since arriving in the ED the pain is settled down and now is a dull throb.  She feels like it is wrapping around out to the left lower side of her abdomen.  She denies any dysuria or hematuria.  She reports has a history of kidney stones many years ago but has not passed 1 recently.  She denies any fevers or chills.  She has mild nausea but no vomiting.  She does report some constipation this week with her last bowel movement 2 days ago.  HPI     Past Medical History:  Diagnosis Date  . GERD (gastroesophageal reflux disease)     Patient Active Problem List   Diagnosis Date Noted  . OBESITY, NOS 10/31/2006  . GASTROESOPHAGEAL REFLUX, NO ESOPHAGITIS 10/31/2006    History reviewed. No pertinent surgical history.   OB History   No obstetric history on file.     No family history on file.  Social History   Tobacco Use  . Smoking status: Never Smoker  . Smokeless tobacco: Never Used  Substance Use Topics  . Alcohol use: No  . Drug use: No    Home Medications Prior to Admission medications   Medication Sig Start Date End Date Taking? Authorizing Provider  hydrochlorothiazide (HYDRODIURIL) 12.5 MG tablet Take 1 tablet (12.5 mg total) by mouth daily. 02/04/20  Yes Arnette Felts, FNP  Magnesium 200 MG TABS Take daily with evening meal 02/04/20  Yes Arnette Felts, FNP  metFORMIN (GLUCOPHAGE) 500 MG tablet Take 1 tablet (500 mg total) by mouth daily with breakfast.  02/04/20 02/03/21 Yes Arnette Felts, FNP  ibuprofen (ADVIL) 600 MG tablet Take 1 tablet (600 mg total) by mouth every 6 (six) hours as needed for mild pain or moderate pain. 03/18/20 04/17/20  Terald Sleeper, MD  oxyCODONE-acetaminophen (PERCOCET/ROXICET) 5-325 MG tablet Take 1 tablet by mouth every 6 (six) hours as needed for up to 6 doses for severe pain. 03/18/20   Terald Sleeper, MD  senna-docusate (SENOKOT-S) 8.6-50 MG tablet Take 1 tablet by mouth daily for 20 doses. 03/18/20 04/07/20  Terald Sleeper, MD    Allergies    Patient has no known allergies.  Review of Systems   Review of Systems  Constitutional: Negative for chills and fever.  HENT: Negative for ear pain and sore throat.   Respiratory: Negative for cough and shortness of breath.   Cardiovascular: Negative for chest pain and palpitations.  Gastrointestinal: Positive for abdominal pain, constipation, nausea and vomiting.  Genitourinary: Positive for flank pain. Negative for difficulty urinating, dysuria and hematuria.  Musculoskeletal: Positive for back pain. Negative for arthralgias.  Skin: Negative for color change and rash.  Allergic/Immunologic: Negative for food allergies and immunocompromised state.  Neurological: Negative for syncope and headaches.  Psychiatric/Behavioral: Negative for agitation and confusion.  All other systems reviewed and are negative.   Physical Exam Updated Vital Signs BP (!) 142/75 (  BP Location: Left Arm)   Pulse 88   Temp 97.9 F (36.6 C) (Oral)   Resp 18   SpO2 100%   Physical Exam Vitals and nursing note reviewed.  Constitutional:      General: She is not in acute distress.    Appearance: She is well-developed. She is obese.  HENT:     Head: Normocephalic and atraumatic.  Eyes:     Conjunctiva/sclera: Conjunctivae normal.  Cardiovascular:     Rate and Rhythm: Normal rate and regular rhythm.  Pulmonary:     Effort: Pulmonary effort is normal. No respiratory distress.    Abdominal:     Palpations: Abdomen is soft.     Tenderness: There is no abdominal tenderness. There is no right CVA tenderness, left CVA tenderness, guarding or rebound. Negative signs include Murphy's sign and McBurney's sign.     Hernia: No hernia is present.  Musculoskeletal:     Cervical back: Neck supple.  Skin:    General: Skin is warm and dry.     Capillary Refill: Capillary refill takes less than 2 seconds.  Neurological:     General: No focal deficit present.     Mental Status: She is alert and oriented to person, place, and time.  Psychiatric:        Mood and Affect: Mood normal.        Behavior: Behavior normal.     ED Results / Procedures / Treatments   Labs (all labs ordered are listed, but only abnormal results are displayed) Labs Reviewed  COMPREHENSIVE METABOLIC PANEL - Abnormal; Notable for the following components:      Result Value   Potassium 3.3 (*)    Glucose, Bld 135 (*)    All other components within normal limits  CBC - Abnormal; Notable for the following components:   Hemoglobin 11.6 (*)    MCH 25.6 (*)    Platelets 444 (*)    All other components within normal limits  URINALYSIS, ROUTINE W REFLEX MICROSCOPIC - Abnormal; Notable for the following components:   Hgb urine dipstick LARGE (*)    Bacteria, UA RARE (*)    All other components within normal limits  LIPASE, BLOOD  I-STAT BETA HCG BLOOD, ED ([facility], [facility], [facility] ONLY)    EKG None  Radiology No results found.  Procedures Procedures (including critical care time)  Medications Ordered in ED Medications  sodium chloride flush (NS) 0.9 % injection 3 mL (has no administration in time range)  oxyCODONE-acetaminophen (PERCOCET/ROXICET) 5-325 MG per tablet 1 tablet (1 tablet Oral Given 03/18/20 1007)    ED Course  I have reviewed the triage vital signs and the nursing notes.  Pertinent labs & imaging results that were available during my care of the patient were reviewed by me and considered  in my medical decision making (see chart for details).  This is a 39 year old female presented emergency department abrupt onset left-sided flank pain that woke her from sleep this morning, associated with nausea and vomiting due to pain intensity.  Her symptoms have since abated but are present as a dull ache.  Clinically this is most consistent with ureteral colic from a kidney stone.  Her labs show hemoglobin in her urine.  There is no evidence of infection in her UA.  She has no leukocytosis or flank pain to suggest pyelonephritis.  Discussed preemptive treatment for kidney stone, with pain management as well as fluids at home.  Discussed CT imaging with the patient, she  would prefer to defer at this time.  I agree with this plan and do not believe she needs emergent imaging, and explained that most kidney stones to pass spontaneously.  If her symptoms are worsening at home, or she begins having fevers, she needs to return to the ER and at that point she may have advanced imaging performed.  I have a lower suspicion for acute appendicitis, bowel perforation, diverticulitis, ovarian torsion, or cholecystitis, based on her history, physical exam, and lab work.  I personally reviewed her labs which showed WBC 7.9, Hgb 11.6, BMP largely unremarkable (K 3.3), UA with large hgb, negative leuks & nitrites.  Lipase 16.  Preg negative.  PO percocet given for pain here.  She works at the [employer].     Final Clinical Impression(s) / ED Diagnoses Final diagnoses:  Left flank pain    Rx / DC Orders ED Discharge Orders         Ordered    ibuprofen (ADVIL) 600 MG tablet  Every 6 hours PRN     Discontinue  Reprint     03/18/20 1005    senna-docusate (SENOKOT-S) 8.6-50 MG tablet  Daily     Discontinue  Reprint     03/18/20 1005    oxyCODONE-acetaminophen (PERCOCET/ROXICET) 5-325 MG tablet  Every 6 hours PRN     Discontinue  Reprint     03/18/20 1006           Mikiyah Glasner, Kermit Balo,  MD 03/18/20 1010

## 2020-04-06 ENCOUNTER — Ambulatory Visit: Payer: No Typology Code available for payment source | Admitting: Nurse Practitioner

## 2020-04-06 ENCOUNTER — Encounter: Payer: Self-pay | Admitting: Nurse Practitioner

## 2020-04-06 ENCOUNTER — Other Ambulatory Visit: Payer: Self-pay

## 2020-04-06 VITALS — BP 134/76 | HR 99 | Temp 98.5°F | Ht 63.8 in | Wt 288.0 lb

## 2020-04-06 DIAGNOSIS — I10 Essential (primary) hypertension: Secondary | ICD-10-CM | POA: Insufficient documentation

## 2020-04-06 DIAGNOSIS — Z6841 Body Mass Index (BMI) 40.0 and over, adult: Secondary | ICD-10-CM

## 2020-04-06 MED ORDER — HYDROCHLOROTHIAZIDE 12.5 MG PO TABS: 25.0000 mg | ORAL_TABLET | Freq: Every day | ORAL | 1 refills | Status: DC

## 2020-04-06 MED ORDER — WEGOVY 0.5 MG/0.5ML ~~LOC~~ SOAJ: 0.5000 mg | SUBCUTANEOUS | 0 refills | Status: DC

## 2020-04-06 NOTE — Patient Instructions (Signed)
Obesity, Adult Obesity is the condition of having too much total body fat. Being overweight or obese means that your weight is greater than what is considered healthy for your body size. Obesity is determined by a measurement called BMI. BMI is an estimate of body fat and is calculated from height and weight. For adults, a BMI of 30 or higher is considered obese. Obesity can lead to other health concerns and major illnesses, including:  Stroke.  Coronary artery disease (CAD).  Type 2 diabetes.  Some types of cancer, including cancers of the colon, breast, uterus, and gallbladder.  Osteoarthritis.  High blood pressure (hypertension).  High cholesterol.  Sleep apnea.  Gallbladder stones.  Infertility problems. What are the causes? Common causes of this condition include:  Eating daily meals that are high in calories, sugar, and fat.  Being born with genes that may make you more likely to become obese.  Having a medical condition that causes obesity, including: ? Hypothyroidism. ? Polycystic ovarian syndrome (PCOS). ? Binge-eating disorder. ? Cushing syndrome.  Taking certain medicines, such as steroids, antidepressants, and seizure medicines.  Not being physically active (sedentary lifestyle).  Not getting enough sleep.  Drinking high amounts of sugar-sweetened beverages, such as soft drinks. What increases the risk? The following factors may make you more likely to develop this condition:  Having a family history of obesity.  Being a woman of African American descent.  Being a man of Hispanic descent.  Living in an area with limited access to: ? Parks, recreation centers, or sidewalks. ? Healthy food choices, such as grocery stores and farmers' markets. What are the signs or symptoms? The main sign of this condition is having too much body fat. How is this diagnosed? This condition is diagnosed based on:  Your BMI. If you are an adult with a BMI of 30 or  higher, you are considered obese.  Your waist circumference. This measures the distance around your waistline.  Your skinfold thickness. Your health care provider may gently pinch a fold of your skin and measure it. You may have other tests to check for underlying conditions. How is this treated? Treatment for this condition often includes changing your lifestyle. Treatment may include some or all of the following:  Dietary changes. This may include developing a healthy meal plan.  Regular physical activity. This may include activity that causes your heart to beat faster (aerobic exercise) and strength training. Work with your health care provider to design an exercise program that works for you.  Medicine to help you lose weight if you are unable to lose 1 pound a week after 6 weeks of healthy eating and more physical activity.  Treating conditions that cause the obesity (underlying conditions).  Surgery. Surgical options may include gastric banding and gastric bypass. Surgery may be done if: ? Other treatments have not helped to improve your condition. ? You have a BMI of 40 or higher. ? You have life-threatening health problems related to obesity. Follow these instructions at home: Eating and drinking   Follow recommendations from your health care provider about what you eat and drink. Your health care provider may advise you to: ? Limit fast food, sweets, and processed snack foods. ? Choose low-fat options, such as low-fat milk instead of whole milk. ? Eat 5 or more servings of fruits or vegetables every day. ? Eat at home more often. This gives you more control over what you eat. ? Choose healthy foods when you eat out. ?   Learn to read food labels. This will help you understand how much food is considered 1 serving. ? Learn what a healthy serving size is. ? Keep low-fat snacks available. ? Limit sugary drinks, such as soda, fruit juice, sweetened iced tea, and flavored  milk.  Drink enough water to keep your urine pale yellow.  Do not follow a fad diet. Fad diets can be unhealthy and even dangerous. Physical activity  Exercise regularly, as told by your health care provider. ? Most adults should get up to 150 minutes of moderate-intensity exercise every week. ? Ask your health care provider what types of exercise are safe for you and how often you should exercise.  Warm up and stretch before being active.  Cool down and stretch after being active.  Rest between periods of activity. Lifestyle  Work with your health care provider and a dietitian to set a weight-loss goal that is healthy and reasonable for you.  Limit your screen time.  Find ways to reward yourself that do not involve food.  Do not drink alcohol if: ? Your health care provider tells you not to drink. ? You are pregnant, may be pregnant, or are planning to become pregnant.  If you drink alcohol: ? Limit how much you use to:  0-1 drink a day for women.  0-2 drinks a day for men. ? Be aware of how much alcohol is in your drink. In the [place] one drink equals one 12 oz bottle of beer (355 mL), one 5 oz glass of wine (148 mL), or one 1 oz glass of hard liquor (44 mL). General instructions  Keep a weight-loss journal to keep track of the food you eat and how much exercise you get.  Take over-the-counter and prescription medicines only as told by your health care provider.  Take vitamins and supplements only as told by your health care provider.  Consider joining a support group. Your health care provider may be able to recommend a support group.  Keep all follow-up visits as told by your health care provider. This is important. Contact a health care provider if:  You are unable to meet your weight loss goal after 6 weeks of dietary and lifestyle changes. Get help right away if you are having:  Trouble breathing.  Suicidal thoughts or behaviors. Summary  Obesity is the  condition of having too much total body fat.  Being overweight or obese means that your weight is greater than what is considered healthy for your body size.  Work with your health care provider and a dietitian to set a weight-loss goal that is healthy and reasonable for you.  Exercise regularly, as told by your health care provider. Ask your health care provider what types of exercise are safe for you and how often you should exercise. This information is not intended to replace advice given to you by your health care provider. Make sure you discuss any questions you have with your health care provider. Document Revised: 04/24/2018 Document Reviewed: 04/24/2018 Elsevier Patient Education  2020 Elsevier Inc.  

## 2020-04-06 NOTE — Progress Notes (Signed)
I,Tianna Badgett,acting as a Neurosurgeon for [employer], FNP.,have documented all relevant documentation on the behalf of Arnette Felts, FNP,as directed by  Arnette Felts, FNP while in the presence of Arnette Felts, FNP.  This visit occurred during the SARS-CoV-2 public health emergency.  Safety protocols were in place, including screening questions prior to the visit, additional usage of staff PPE, and extensive cleaning of exam room while observing appropriate contact time as indicated for disinfecting solutions.  Subjective:     Patient ID: Sheryl Garcia , female    DOB: July 14, 1981 , 39 y.o.   MRN: [id number]   Chief Complaint  Patient presents with  . Obesity    HPI  Patient is here for a weight check. Currently she is not as active as she would like to be. She states that she has changed her diet. Pt desires to try medication for weight loss.  Continues with metformin.  She is willing to incorporate more physical activity.      Past Medical History:  Diagnosis Date  . GERD (gastroesophageal reflux disease)      History reviewed. No pertinent family history.   Current Outpatient Medications:  .  hydrochlorothiazide (HYDRODIURIL) 12.5 MG tablet, Take 2 tablets (25 mg total) by mouth daily., Disp: 90 tablet, Rfl: 1 .  Magnesium 200 MG TABS, Take daily with evening meal, Disp: 30 tablet, Rfl: 3 .  metFORMIN (GLUCOPHAGE) 500 MG tablet, Take 1 tablet (500 mg total) by mouth daily with breakfast., Disp: 90 tablet, Rfl: 1 .  Semaglutide-Weight Management (WEGOVY) 0.5 MG/0.5ML SOAJ, Inject 0.5 mLs (0.5 mg total) into the skin once a week., Disp: 2.4 mL, Rfl: 0   No Known Allergies   Review of Systems  Constitutional: Negative.  Negative for fatigue.  Respiratory: Negative.   Cardiovascular: Negative.  Negative for chest pain, palpitations and leg swelling.  Neurological: Negative.   Psychiatric/Behavioral: Negative.      Today's Vitals   04/06/20 1621  BP: 134/76  Pulse: 99   Temp: 98.5 F (36.9 C)  TempSrc: Oral  Weight: 288 lb (130.6 kg)  Height: 5' 3.8" (1.621 m)   Body mass index is 49.75 kg/m.   Objective:  Physical Exam Vitals reviewed.  Constitutional:      General: She is not in acute distress.    Appearance: Normal appearance.  Cardiovascular:     Rate and Rhythm: Normal rate and regular rhythm.     Pulses: Normal pulses.     Heart sounds: Normal heart sounds. No murmur heard.   Pulmonary:     Effort: No respiratory distress.     Breath sounds: Normal breath sounds.  Neurological:     General: No focal deficit present.     Mental Status: She is alert and oriented to person, place, and time.     Cranial Nerves: No cranial nerve deficit.  Psychiatric:        Mood and Affect: Mood normal.        Behavior: Behavior normal.        Thought Content: Thought content normal.        Judgment: Judgment normal.         Assessment And Plan:     1. Essential hypertension  Chronic, blood pressure is under good control.  - hydrochlorothiazide (HYDRODIURIL) 12.5 MG tablet; Take 2 tablets (25 mg total) by mouth daily.  Dispense: 90 tablet; Refill: 1  2. Class 3 severe obesity due to excess calories without serious comorbidity with body  mass index (BMI) of 45.0 to 49.9 in adult [facility])  She has lost 4 lbs since her last visit  Will start her on Wegovy 0.25 mg sample given with a coupon card  She is encouraged to strive for BMI less than 30 to decrease cardiac risk. Advised to aim for at least 150 minutes of exercise per week.  Wt Readings from Last 3 Encounters:  05/05/20 288 lb (130.6 kg)  04/06/20 288 lb (130.6 kg)  02/04/20 292 lb 12.8 oz (132.8 kg)     Patient was given opportunity to ask questions. Patient verbalized understanding of the plan and was able to repeat key elements of the plan. All questions were answered to their satisfaction.  Arnette Felts, FNP   I, Arnette Felts, FNP, have reviewed all documentation for this visit.  The documentation on 05/06/20 for the exam, diagnosis, procedures, and orders are all accurate and complete.  THE PATIENT IS ENCOURAGED TO PRACTICE SOCIAL DISTANCING DUE TO THE COVID-19 PANDEMIC.

## 2020-04-12 LAB — HM PAP SMEAR

## 2020-05-03 ENCOUNTER — Other Ambulatory Visit: Payer: Self-pay | Admitting: Nurse Practitioner

## 2020-05-05 ENCOUNTER — Ambulatory Visit: Payer: No Typology Code available for payment source | Admitting: Nurse Practitioner

## 2020-05-05 ENCOUNTER — Other Ambulatory Visit: Payer: Self-pay

## 2020-05-05 ENCOUNTER — Encounter: Payer: Self-pay | Admitting: Nurse Practitioner

## 2020-05-05 VITALS — BP 138/84 | HR 91 | Temp 98.1°F | Ht 63.8 in | Wt 288.0 lb

## 2020-05-05 DIAGNOSIS — E669 Obesity, unspecified: Secondary | ICD-10-CM

## 2020-05-05 MED ORDER — WEGOVY 0.5 MG/0.5ML ~~LOC~~ SOAJ: 0.5000 mg | SUBCUTANEOUS | 0 refills | Status: DC

## 2020-05-05 NOTE — Patient Instructions (Signed)
Goal to lose 3 lbs before next visit in one month Increase physical activity to at least 30 minutes a day 3 days a week.

## 2020-05-05 NOTE — Progress Notes (Signed)
This visit occurred during the SARS-CoV-2 public health emergency.  Safety protocols were in place, including screening questions prior to the visit, additional usage of staff PPE, and extensive cleaning of exam room while observing appropriate contact time as indicated for disinfecting solutions.  Subjective:     Patient ID: Sheryl Garcia , female    DOB: 1981-03-24 , 39 y.o.   MRN: [id number]   Chief Complaint  Patient presents with  . Weight Check    HPI  Here for weight check  Wt Readings from Last 3 Encounters: 05/05/20 : 288 lb (130.6 kg) 04/06/20 : 288 lb (130.6 kg) 02/04/20 : 292 lb 12.8 oz (132.8 kg)  She is tolerated the [facility] well. Denies nausea. Does have heartburn occasionally. She does notice her appetite has been suppressed.   She is working day shift. Has decreased her water intake below 64 oz. She is working at [employer] so will get busy.  Does admit to being a stress eater.     Past Medical History:  Diagnosis Date  . GERD (gastroesophageal reflux disease)      History reviewed. No pertinent family history.   Current Outpatient Medications:  .  hydrochlorothiazide (HYDRODIURIL) 12.5 MG tablet, Take 2 tablets (25 mg total) by mouth daily., Disp: 90 tablet, Rfl: 1 .  Magnesium 200 MG TABS, Take daily with evening meal, Disp: 30 tablet, Rfl: 3 .  metFORMIN (GLUCOPHAGE) 500 MG tablet, Take 1 tablet (500 mg total) by mouth daily with breakfast., Disp: 90 tablet, Rfl: 1 .  Semaglutide-Weight Management (WEGOVY) 0.5 MG/0.5ML SOAJ, Inject 0.5 mLs (0.5 mg total) into the skin once a week., Disp: 2.4 mL, Rfl: 0   No Known Allergies   Review of Systems  Constitutional: Negative.   Respiratory: Negative.   Cardiovascular: Negative.  Negative for chest pain, palpitations and leg swelling.  Musculoskeletal: Negative.   Skin: Negative.   Psychiatric/Behavioral: Negative.      Today's Vitals   05/05/20 1557  BP: 138/84  Pulse: 91  Temp: 98.1 F (36.7  C)  TempSrc: Oral  Weight: 288 lb (130.6 kg)  Height: 5' 3.8" (1.621 m)  PainSc: 0-No pain   Body mass index is 49.75 kg/m.   Objective:  Physical Exam Constitutional:      General: She is not in acute distress.    Appearance: Normal appearance.  Cardiovascular:     Rate and Rhythm: Normal rate and regular rhythm.  Neurological:     General: No focal deficit present.     Mental Status: She is alert and oriented to person, place, and time.     Cranial Nerves: No cranial nerve deficit.  Psychiatric:        Mood and Affect: Mood normal.        Behavior: Behavior normal.        Thought Content: Thought content normal.        Judgment: Judgment normal.         Assessment And Plan:     1. Obesity, unspecified classification, unspecified obesity type, unspecified whether serious comorbidity present  Her weight is stable, she is encouraged to introduce more physical activity to at least 150 minutes weekly.   Return for follow up in 2 months.    Patient was given opportunity to ask questions. Patient verbalized understanding of the plan and was able to repeat key elements of the plan. All questions were answered to their satisfaction.   Jeanell Sparrow, FNP, have reviewed all documentation  for this visit. The documentation on 05/09/20 for the exam, diagnosis, procedures, and orders are all accurate and complete.  THE PATIENT IS ENCOURAGED TO PRACTICE SOCIAL DISTANCING DUE TO THE COVID-19 PANDEMIC.

## 2020-05-12 ENCOUNTER — Encounter: Payer: Self-pay | Admitting: Nurse Practitioner

## 2020-05-12 ENCOUNTER — Other Ambulatory Visit: Payer: Self-pay

## 2020-05-12 MED ORDER — WEGOVY 0.5 MG/0.5ML ~~LOC~~ SOAJ: 0.5000 mg | SUBCUTANEOUS | 0 refills | Status: DC

## 2020-06-06 ENCOUNTER — Ambulatory Visit: Payer: No Typology Code available for payment source | Admitting: Nurse Practitioner

## 2020-06-07 ENCOUNTER — Ambulatory Visit: Payer: No Typology Code available for payment source | Admitting: Nurse Practitioner

## 2020-07-04 ENCOUNTER — Other Ambulatory Visit: Payer: Self-pay | Admitting: Nurse Practitioner

## 2020-07-04 DIAGNOSIS — I10 Essential (primary) hypertension: Secondary | ICD-10-CM

## 2020-08-06 ENCOUNTER — Other Ambulatory Visit: Payer: Self-pay | Admitting: Nurse Practitioner

## 2020-08-06 DIAGNOSIS — R7309 Other abnormal glucose: Secondary | ICD-10-CM

## 2020-08-10 ENCOUNTER — Other Ambulatory Visit: Payer: Self-pay | Admitting: Nurse Practitioner

## 2020-08-10 ENCOUNTER — Other Ambulatory Visit: Payer: Self-pay

## 2020-08-10 ENCOUNTER — Ambulatory Visit: Payer: No Typology Code available for payment source | Admitting: Nurse Practitioner

## 2020-08-10 ENCOUNTER — Encounter: Payer: Self-pay | Admitting: Nurse Practitioner

## 2020-08-10 VITALS — BP 146/72 | HR 104 | Temp 98.2°F | Ht 63.8 in | Wt 293.2 lb

## 2020-08-10 DIAGNOSIS — Z23 Encounter for immunization: Secondary | ICD-10-CM

## 2020-08-10 DIAGNOSIS — I10 Essential (primary) hypertension: Secondary | ICD-10-CM

## 2020-08-10 DIAGNOSIS — E7800 Pure hypercholesterolemia, unspecified: Secondary | ICD-10-CM

## 2020-08-10 DIAGNOSIS — Z0000 Encounter for general adult medical examination without abnormal findings: Secondary | ICD-10-CM | POA: Diagnosis not present

## 2020-08-10 DIAGNOSIS — R7309 Other abnormal glucose: Secondary | ICD-10-CM

## 2020-08-10 DIAGNOSIS — E559 Vitamin D deficiency, unspecified: Secondary | ICD-10-CM

## 2020-08-10 DIAGNOSIS — Z6841 Body Mass Index (BMI) 40.0 and over, adult: Secondary | ICD-10-CM

## 2020-08-10 DIAGNOSIS — E669 Obesity, unspecified: Secondary | ICD-10-CM

## 2020-08-10 DIAGNOSIS — R0602 Shortness of breath: Secondary | ICD-10-CM

## 2020-08-10 LAB — POCT URINALYSIS DIPSTICK
Bilirubin, UA: NEGATIVE
Glucose, UA: NEGATIVE
Ketones, UA: NEGATIVE
Leukocytes, UA: NEGATIVE
Nitrite, UA: NEGATIVE
Protein, UA: NEGATIVE
Spec Grav, UA: >=1.03 — AB (ref 1.010–1.025)
Urobilinogen, UA: 0.2 E.U./dL
pH, UA: 6 (ref 5.0–8.0)

## 2020-08-10 LAB — POCT UA - MICROALBUMIN
Albumin/Creatinine Ratio, Urine, POC: 30
Creatinine, POC: 300 mg/dL
Microalbumin Ur, POC: 30 mg/L

## 2020-08-10 MED ORDER — WEGOVY 1 MG/0.5ML ~~LOC~~ SOAJ: 1.0000 mg | SUBCUTANEOUS | 0 refills | Status: DC

## 2020-08-10 NOTE — Patient Instructions (Addendum)
Health Maintenance, Female Adopting a healthy lifestyle and getting preventive care are important in promoting health and wellness. Ask your health care provider about:  The right schedule for you to have regular tests and exams.  Things you can do on your own to prevent diseases and keep yourself healthy. What should I know about diet, weight, and exercise? Eat a healthy diet   Eat a diet that includes plenty of vegetables, fruits, low-fat dairy products, and lean protein.  Do not eat a lot of foods that are high in solid fats, added sugars, or sodium. Maintain a healthy weight Body mass index (BMI) is used to identify weight problems. It estimates body fat based on height and weight. Your health care provider can help determine your BMI and help you achieve or maintain a healthy weight. Get regular exercise Get regular exercise. This is one of the most important things you can do for your health. Most adults should:  Exercise for at least 150 minutes each week. The exercise should increase your heart rate and make you sweat (moderate-intensity exercise).  Do strengthening exercises at least twice a week. This is in addition to the moderate-intensity exercise.  Spend less time sitting. Even light physical activity can be beneficial. Watch cholesterol and blood lipids Have your blood tested for lipids and cholesterol at 41 years of age, then have this test every 5 years. Have your cholesterol levels checked more often if:  Your lipid or cholesterol levels are high.  You are older than 40 years of age.  You are at high risk for heart disease. What should I know about cancer screening? Depending on your health history and family history, you may need to have cancer screening at various ages. This may include screening for:  Breast cancer.  Cervical cancer.  Colorectal cancer.  Skin cancer.  Lung cancer. What should I know about heart disease, diabetes, and high blood  pressure? Blood pressure and heart disease  High blood pressure causes heart disease and increases the risk of stroke. This is more likely to develop in people who have high blood pressure readings, are of African descent, or are overweight.  Have your blood pressure checked: ? Every 3-5 years if you are 18-39 years of age. ? Every year if you are 40 years old or older. Diabetes Have regular diabetes screenings. This checks your fasting blood sugar level. Have the screening done:  Once every three years after age 40 if you are at a normal weight and have a low risk for diabetes.  More often and at a younger age if you are overweight or have a high risk for diabetes. What should I know about preventing infection? Hepatitis B If you have a higher risk for hepatitis B, you should be screened for this virus. Talk with your health care provider to find out if you are at risk for hepatitis B infection. Hepatitis C Testing is recommended for:  Everyone born from 1945 through 1965.  Anyone with known risk factors for hepatitis C. Sexually transmitted infections (STIs)  Get screened for STIs, including gonorrhea and chlamydia, if: ? You are sexually active and are younger than 41 years of age. ? You are older than 41 years of age and your health care provider tells you that you are at risk for this type of infection. ? Your sexual activity has changed since you were last screened, and you are at increased risk for chlamydia or gonorrhea. Ask your health care provider if   you are at risk.  Ask your health care provider about whether you are at high risk for HIV. Your health care provider may recommend a prescription medicine to help prevent HIV infection. If you choose to take medicine to prevent HIV, you should first get tested for HIV. You should then be tested every 3 months for as long as you are taking the medicine. Pregnancy  If you are about to stop having your period (premenopausal) and  you may become pregnant, seek counseling before you get pregnant.  Take 400 to 800 micrograms (mcg) of folic acid every day if you become pregnant.  Ask for birth control (contraception) if you want to prevent pregnancy. Osteoporosis and menopause Osteoporosis is a disease in which the bones lose minerals and strength with aging. This can result in bone fractures. If you are 61 years old or older, or if you are at risk for osteoporosis and fractures, ask your health care provider if you should:  Be screened for bone loss.  Take a calcium or vitamin D supplement to lower your risk of fractures.  Be given hormone replacement therapy (HRT) to treat symptoms of menopause. Follow these instructions at home: Lifestyle  Do not use any products that contain nicotine or tobacco, such as cigarettes, e-cigarettes, and chewing tobacco. If you need help quitting, ask your health care provider.  Do not use street drugs.  Do not share needles.  Ask your health care provider for help if you need support or information about quitting drugs. Alcohol use  Do not drink alcohol if: ? Your health care provider tells you not to drink. ? You are pregnant, may be pregnant, or are planning to become pregnant.  If you drink alcohol: ? Limit how much you use to 0-1 drink a day. ? Limit intake if you are breastfeeding.  Be aware of how much alcohol is in your drink. In the [place] one drink equals one 12 oz bottle of beer (355 mL), one 5 oz glass of wine (148 mL), or one 1 oz glass of hard liquor (44 mL). General instructions  Schedule regular health, dental, and eye exams.  Stay current with your vaccines.  Tell your health care provider if: ? You often feel depressed. ? You have ever been abused or do not feel safe at home. Summary  Adopting a healthy lifestyle and getting preventive care are important in promoting health and wellness.  Follow your health care provider's instructions about healthy  diet, exercising, and getting tested or screened for diseases.  Follow your health care provider's instructions on monitoring your cholesterol and blood pressure. This information is not intended to replace advice given to you by your health care provider. Make sure you discuss any questions you have with your health care provider. Document Revised: 08/13/2018 Document Reviewed: 08/13/2018 Elsevier Patient Education  2020 [employer].  Slowly cut back on sodas and junk food, increase physical activity

## 2020-08-10 NOTE — Progress Notes (Signed)
This visit occurred during the SARS-CoV-2 public health emergency.  Safety protocols were in place, including screening questions prior to the visit, additional usage of staff PPE, and extensive cleaning of exam room while observing appropriate contact time as indicated for disinfecting solutions.  Subjective:     Patient ID: Sheryl Garcia , female    DOB: June 16, 1981 , 39 y.o.   MRN: [id number]   Chief Complaint  Patient presents with  . Annual Exam    HPI  Patient here for HM. Patient goes to [facility] she reports she had her pap either June or July of this year. She also had her mammogram  Wt Readings from Last 3 Encounters: 08/10/20 : 293 lb 3.2 oz (133 kg) 05/05/20 : 288 lb (130.6 kg) 04/06/20 : 288 lb (130.6 kg)  She is taking Wegovy 0.5 mg she is restarting. She is not exercising.    She is raising her nephew who is 49 months old since he was born.      Past Medical History:  Diagnosis Date  . GERD (gastroesophageal reflux disease)      History reviewed. No pertinent family history.   Current Outpatient Medications:  .  hydrochlorothiazide (HYDRODIURIL) 12.5 MG tablet, TAKE 2 TABLETS BY MOUTH EVERY DAY, Disp: 180 tablet, Rfl: 1 .  Magnesium 200 MG TABS, Take daily with evening meal, Disp: 30 tablet, Rfl: 3 .  metFORMIN (GLUCOPHAGE) 500 MG tablet, TAKE 1 TABLET BY MOUTH EVERY DAY WITH BREAKFAST, Disp: 90 tablet, Rfl: 1 .  Semaglutide-Weight Management (WEGOVY) 1 MG/0.5ML SOAJ, Inject 1 mg into the skin once a week., Disp: 2 mL, Rfl: 0   No Known Allergies    The patient states she uses SSure for birth control.No LMP recorded. (Menstrual status: Irregular Periods).. Negative for Dysmenorrhea and Negative for Menorrhagia. Negative for: breast discharge, breast lump(s), breast pain and breast self exam. Associated symptoms include abnormal vaginal bleeding. Pertinent negatives include abnormal bleeding (hematology), anxiety, decreased libido, depression,  difficulty falling sleep, dyspareunia, history of infertility, nocturia, sexual dysfunction, sleep disturbances, urinary incontinence, urinary urgency, vaginal discharge and vaginal itching. Diet regular; she has not been doing well with her eating. The patient states her exercise level is minimal  The patient's tobacco use is:  Social History   Tobacco Use  Smoking Status Never Smoker  Smokeless Tobacco Never Used  . She has been exposed to passive smoke. The patient's alcohol use is:  Social History   Substance and Sexual Activity  Alcohol Use No   Additional information: Last pap 2021, next one scheduled for 2024  Review of Systems  Constitutional: Negative.   HENT: Negative.   Eyes: Negative.   Respiratory: Negative.   Cardiovascular: Negative.   Gastrointestinal: Negative.   Endocrine: Negative.   Genitourinary: Negative.   Musculoskeletal: Negative.   Skin: Negative.   Allergic/Immunologic: Negative.   Neurological: Negative.   Hematological: Negative.   Psychiatric/Behavioral: Negative.      Today's Vitals   08/10/20 1549  BP: (!) 146/72  Pulse: (!) 104  Temp: 98.2 F (36.8 C)  TempSrc: Oral  Weight: 293 lb 3.2 oz (133 kg)  Height: 5' 3.8" (1.621 m)  PainSc: 0-No pain   Body mass index is 50.64 kg/m.   Objective:  Physical Exam Constitutional:      General: She is not in acute distress.    Appearance: Normal appearance. She is well-developed. She is obese.  HENT:     Head: Normocephalic and atraumatic.  Right Ear: Hearing, tympanic membrane, ear canal and external ear normal. There is no impacted cerumen.     Left Ear: Hearing, tympanic membrane, ear canal and external ear normal. There is no impacted cerumen.     Nose:     Comments: Deferred - masked    Mouth/Throat:     Comments: Deferred - masked Eyes:     General: Lids are normal.     Extraocular Movements: Extraocular movements intact.     Conjunctiva/sclera: Conjunctivae normal.      Pupils: Pupils are equal, round, and reactive to light.     Funduscopic exam:    Right eye: No papilledema.        Left eye: No papilledema.  Neck:     Thyroid: No thyroid mass.     Vascular: No carotid bruit.  Cardiovascular:     Rate and Rhythm: Normal rate and regular rhythm.     Pulses: Normal pulses.     Heart sounds: Normal heart sounds. No murmur heard.   Pulmonary:     Effort: Pulmonary effort is normal. No respiratory distress.     Breath sounds: Normal breath sounds. No wheezing.  Chest:     Chest wall: No mass.     Breasts: Tanner Score is 5.        Right: Normal. No mass or tenderness.        Left: Normal. No mass or tenderness.  Abdominal:     General: Abdomen is flat. Bowel sounds are normal. There is no distension.     Palpations: Abdomen is soft.     Tenderness: There is no abdominal tenderness.  Genitourinary:    Rectum: Guaiac result negative.  Musculoskeletal:        General: No swelling. Normal range of motion.     Cervical back: Full passive range of motion without pain, normal range of motion and neck supple.     Right lower leg: No edema.     Left lower leg: No edema.  Lymphadenopathy:     Upper Body:     Right upper body: No supraclavicular, axillary or pectoral adenopathy.     Left upper body: No supraclavicular, axillary or pectoral adenopathy.  Skin:    General: Skin is warm and dry.     Capillary Refill: Capillary refill takes less than 2 seconds.  Neurological:     General: No focal deficit present.     Mental Status: She is alert and oriented to person, place, and time.     Cranial Nerves: No cranial nerve deficit.     Sensory: No sensory deficit.  Psychiatric:        Mood and Affect: Mood normal.        Behavior: Behavior normal.        Thought Content: Thought content normal.        Judgment: Judgment normal.         Assessment And Plan:     1. Encounter for general adult medical examination w/o abnormal findings . Behavior  modifications discussed and diet history reviewed.   . Pt will continue to exercise regularly and modify diet with low GI, plant based foods and decrease intake of processed foods.  . Recommend intake of daily multivitamin, Vitamin D, and calcium.  . Recommend mammogram for preventive screenings, as well as recommend immunizations that include influenza, TDAP (will do today) - CMP14+EGFR - CBC  2. Essential hypertension . B/P is fairly controlled  . CMP ordered to  check renal function.  . The importance of regular exercise and dietary modification was stressed to the patient.  . Stressed importance of losing ten percent of her body weight to help with B/P control.  . The weight loss would help with decreasing cardiac and cancer risk as well.   - CMP14+EGFR - POCT Urinalysis Dipstick (81002) - POCT UA - Microalbumin  3. Abnormal glucose  Chronic, stable  No current medications for diabetes but she is on Wegovy which is a GLP-1  Encouraged to limit intake of sugary foods and drinks  Encouraged to increase physical activity to 150 minutes per week, she is encouraged to start slow and work her way up - Hemoglobin A1c  4. Obesity, unspecified classification, unspecified obesity type, unspecified whether serious comorbidity present  Chronic  Discussed healthy diet and regular exercise options   Encouraged to exercise at least 150 minutes per week with 2 days of strength training  Continue with Wegovy 1 mg weekly   Return in 1 month for weight check.  5. BMI 50.0-59.9, adult ([place]) Wt Readings from Last 3 Encounters:  08/10/20 293 lb 3.2 oz (133 kg)  05/05/20 288 lb (130.6 kg)  04/06/20 288 lb (130.6 kg)   - Semaglutide-Weight Management (WEGOVY) 1 MG/0.5ML SOAJ; Inject 1 mg into the skin once a week.  Dispense: 2 mL; Refill: 0  6. Encounter for immunization  Will give tetanus vaccine today while in office. TDAP will be administered to adults 68-81 years old every 10  years. - Tdap vaccine greater than or equal to 7yo IM  7. Vitamin D deficiency  Will check vitamin D level and supplement as needed.     Also encouraged to spend 15 minutes in the sun daily.  - VITAMIN D 25 Hydroxy (Vit-D Deficiency, Fractures)  8. Elevated cholesterol  She had slightly elevated cholesterol Encouraged to eat a low fat diet - Lipid panel  9. Shortness of breath  No abnormal findings on exam  This could be related to deconditioning, she is encouraged to increase her physical activity   Patient was given opportunity to ask questions. Patient verbalized understanding of the plan and was able to repeat key elements of the plan. All questions were answered to their satisfaction.   Teola Bradley, FNP, have reviewed all documentation for this visit. The documentation on 08/28/20 for the exam, diagnosis, procedures, and orders are all accurate and complete.   THE PATIENT IS ENCOURAGED TO PRACTICE SOCIAL DISTANCING DUE TO THE COVID-19 PANDEMIC.

## 2020-08-11 LAB — CMP14+EGFR
ALT: 17 IU/L (ref 0–32)
AST: 16 IU/L (ref 0–40)
Albumin/Globulin Ratio: 1.2 (ref 1.2–2.2)
Albumin: 4.1 g/dL (ref 3.8–4.8)
Alkaline Phosphatase: 91 IU/L (ref 44–121)
BUN/Creatinine Ratio: 16 (ref 9–23)
BUN: 11 mg/dL (ref 6–24)
Bilirubin Total: <0.2 mg/dL (ref 0.0–1.2)
CO2: 22 mmol/L (ref 20–29)
Calcium: 10.2 mg/dL (ref 8.7–10.2)
Chloride: 105 mmol/L (ref 96–106)
Creatinine, Ser: 0.67 mg/dL (ref 0.57–1.00)
GFR calc Af Amer: 126 mL/min/{1.73_m2} (ref >59)
GFR calc non Af Amer: 110 mL/min/{1.73_m2} (ref >59)
Globulin, Total: 3.4 g/dL (ref 1.5–4.5)
Glucose: 75 mg/dL (ref 65–99)
Potassium: 4.2 mmol/L (ref 3.5–5.2)
Sodium: 137 mmol/L (ref 134–144)
Total Protein: 7.5 g/dL (ref 6.0–8.5)

## 2020-08-11 LAB — LIPID PANEL
Chol/HDL Ratio: 4 ratio (ref 0.0–4.4)
Cholesterol, Total: 212 mg/dL — ABNORMAL HIGH (ref 100–199)
HDL: 53 mg/dL (ref >39)
LDL Chol Calc (NIH): 145 mg/dL — ABNORMAL HIGH (ref 0–99)
Triglycerides: 78 mg/dL (ref 0–149)
VLDL Cholesterol Cal: 14 mg/dL (ref 5–40)

## 2020-08-11 LAB — CBC
Hematocrit: 37.1 % (ref 34.0–46.6)
Hemoglobin: 11.6 g/dL (ref 11.1–15.9)
MCH: 25.1 pg — ABNORMAL LOW (ref 26.6–33.0)
MCHC: 31.3 g/dL — ABNORMAL LOW (ref 31.5–35.7)
MCV: 80 fL (ref 79–97)
Platelets: 424 10*3/uL (ref 150–450)
RBC: 4.62 x10E6/uL (ref 3.77–5.28)
RDW: 15 % (ref 11.7–15.4)
WBC: 11.9 10*3/uL — ABNORMAL HIGH (ref 3.4–10.8)

## 2020-08-11 LAB — HEMOGLOBIN A1C
Est. average glucose Bld gHb Est-mCnc: 114 mg/dL
Hgb A1c MFr Bld: 5.6 % (ref 4.8–5.6)

## 2020-08-11 LAB — VITAMIN D 25 HYDROXY (VIT D DEFICIENCY, FRACTURES): Vit D, 25-Hydroxy: 21.4 ng/mL — ABNORMAL LOW (ref 30.0–100.0)

## 2020-08-15 ENCOUNTER — Encounter: Payer: Self-pay | Admitting: Nurse Practitioner

## 2020-08-29 ENCOUNTER — Telehealth: Payer: Self-pay

## 2020-08-29 NOTE — Telephone Encounter (Signed)
-----   Message from Arnette Felts, FNP sent at 08/28/2020  2:44 PM EST ----- Your kidney and liver functions are normal. Potassium levels are normal. Your white count was slightly elevated, how are you feeling: HgbA1c is normal at 5.6 down from 5.9.  vitamin d is 21.4 this is low, are you taking any vitamin d supplements? Total cholesterol is 212 goal is less than 199 and LDL is 145 goal is less than 99, limit intake of fried and fatty foods.

## 2020-08-30 MED FILL — WEGOVY 1 MG/0.5ML SOAJ: 1 | 28 days supply | Qty: 2 | Fill #0 | Status: TO

## 2020-08-31 ENCOUNTER — Other Ambulatory Visit: Payer: Self-pay

## 2020-08-31 DIAGNOSIS — R7309 Other abnormal glucose: Secondary | ICD-10-CM

## 2020-08-31 MED ORDER — METFORMIN HCL 500 MG PO TABS: ORAL_TABLET | ORAL | 1 refills | Status: DC

## 2020-09-13 ENCOUNTER — Encounter: Payer: Self-pay | Admitting: Nurse Practitioner

## 2020-09-13 ENCOUNTER — Other Ambulatory Visit: Payer: Self-pay | Admitting: Nurse Practitioner

## 2020-09-13 ENCOUNTER — Other Ambulatory Visit: Payer: Self-pay

## 2020-09-13 ENCOUNTER — Ambulatory Visit: Payer: No Typology Code available for payment source | Admitting: Nurse Practitioner

## 2020-09-13 VITALS — BP 120/82 | HR 99 | Temp 98.7°F | Ht 63.6 in | Wt 289.8 lb

## 2020-09-13 DIAGNOSIS — R7309 Other abnormal glucose: Secondary | ICD-10-CM

## 2020-09-13 DIAGNOSIS — I10 Essential (primary) hypertension: Secondary | ICD-10-CM | POA: Diagnosis not present

## 2020-09-13 DIAGNOSIS — Z23 Encounter for immunization: Secondary | ICD-10-CM | POA: Diagnosis not present

## 2020-09-13 DIAGNOSIS — Z6841 Body Mass Index (BMI) 40.0 and over, adult: Secondary | ICD-10-CM

## 2020-09-13 MED ORDER — TETANUS-DIPHTH-ACELL PERTUSSIS 5-2.5-18.5 LF-MCG/0.5 IM SUSY
0.5000 mL | PREFILLED_SYRINGE | Freq: Once | INTRAMUSCULAR | Status: AC
  Administered 2020-09-13: 0.5 mL via INTRAMUSCULAR

## 2020-09-13 MED ORDER — WEGOVY 1.7 MG/0.75ML ~~LOC~~ SOAJ: 1.7000 mg | SUBCUTANEOUS | 0 refills | Status: DC

## 2020-09-13 MED FILL — WEGOVY 1.7 MG/0.75ML SOAJ: 1.7 | 28 days supply | Qty: 3 | Fill #0

## 2020-09-13 NOTE — Patient Instructions (Signed)

## 2020-09-13 NOTE — Progress Notes (Signed)
[employer] as a Neurosurgeon for [employer], FNP.,have documented all relevant documentation on the behalf of Arnette Felts, FNP,as directed by  Arnette Felts, FNP while in the presence of Arnette Felts, FNP. This visit occurred during the SARS-CoV-2 public health emergency.  Safety protocols were in place, including screening questions prior to the visit, additional usage of staff PPE, and extensive cleaning of exam room while observing appropriate contact time as indicated for disinfecting solutions.  Subjective:     Patient ID: Sheryl Garcia , female    DOB: 1980-09-08 , 40 y.o.   MRN: [id number]   Chief Complaint  Patient presents with   Weight Check    HPI  Patient presents today for a weight check.  She has not started exercising this month yet. She has noticed a decrease in appetite. Denies nausea. She is drinking approximately 40 oz.  She has cut back on her ginger ale to 1-2 times a week.    Wt Readings from Last 3 Encounters: 09/13/20 : 289 lb 12.8 oz (131.5 kg) 08/10/20 : 293 lb 3.2 oz (133 kg) 05/05/20 : 288 lb (130.6 kg)    Past Medical History:  Diagnosis Date   GERD (gastroesophageal reflux disease)      No family history on file.   Current Outpatient Medications:    hydrochlorothiazide (HYDRODIURIL) 12.5 MG tablet, TAKE 2 TABLETS BY MOUTH EVERY DAY, Disp: 180 tablet, Rfl: 1   Magnesium 200 MG TABS, Take daily with evening meal, Disp: 30 tablet, Rfl: 3   metFORMIN (GLUCOPHAGE) 500 MG tablet, TAKE 1 TABLET BY MOUTH EVERY DAY WITH BREAKFAST, Disp: 90 tablet, Rfl: 1   Semaglutide-Weight Management (WEGOVY) 1.7 MG/0.75ML SOAJ, Inject 1.7 mg into the skin once a week., Disp: 3 mL, Rfl: 0   No Known Allergies   Review of Systems  Constitutional: Negative.   Respiratory: Negative.   Cardiovascular: Negative.   Psychiatric/Behavioral: Negative.      Today's Vitals   09/13/20 1129  BP: 120/82  Pulse: 99  Temp: 98.7 F (37.1 C)  Weight:  289 lb 12.8 oz (131.5 kg)  Height: 5' 3.6" (1.615 m)  PainSc: 0-No pain   Body mass index is 50.37 kg/m.   Objective:  Physical Exam Vitals reviewed.  Constitutional:      General: She is not in acute distress.    Appearance: Normal appearance.  Pulmonary:     Effort: Pulmonary effort is normal. No respiratory distress.  Neurological:     General: No focal deficit present.     Mental Status: She is alert.     Cranial Nerves: No cranial nerve deficit.  Psychiatric:        Mood and Affect: Mood normal.        Behavior: Behavior normal.        Thought Content: Thought content normal.        Judgment: Judgment normal.         Assessment And Plan:     1. Class 3 severe obesity due to excess calories with body mass index (BMI) of 50.0 to 59.9 in adult, unspecified whether serious comorbidity present [facility]) She has had a 4 lb weight loss this time. She is tolerating Wegovy well will increase to 1.7 mg  Her goal for next visit is to lose at least 6 lbs. - Semaglutide-Weight Management (WEGOVY) 1.7 MG/0.75ML SOAJ; Inject 1.7 mg into the skin once a week.  Dispense: 3 mL; Refill: 0  2. Abnormal glucose Chronic,  stable Continue to limit intake of sugary foods and drinks.  3. Essential hypertension Chronic, good control Continue with current medications, stay well hydrated  4. Encounter for immunization  Will give tetanus vaccine today while in office. TDAP will be administered to adults 77-33 years old every 10 years.    Patient was given opportunity to ask questions. Patient verbalized understanding of the plan and was able to repeat key elements of the plan. All questions were answered to their satisfaction.  Arnette Felts, FNP   I, Arnette Felts, FNP, have reviewed all documentation for this visit. The documentation on 09/13/20 for the exam, diagnosis, procedures, and orders are all accurate and complete.   THE PATIENT IS ENCOURAGED TO PRACTICE SOCIAL DISTANCING DUE TO THE  COVID-19 PANDEMIC.

## 2020-09-20 ENCOUNTER — Other Ambulatory Visit: Payer: Self-pay | Admitting: Nurse Practitioner

## 2020-09-20 DIAGNOSIS — E559 Vitamin D deficiency, unspecified: Secondary | ICD-10-CM

## 2020-09-20 MED ORDER — VITAMIN D (ERGOCALCIFEROL) 1.25 MG (50000 UNIT) PO CAPS: 50000.0000 [IU] | ORAL_CAPSULE | ORAL | 0 refills | Status: DC

## 2020-10-13 ENCOUNTER — Other Ambulatory Visit: Payer: Self-pay | Admitting: Nurse Practitioner

## 2020-10-13 ENCOUNTER — Other Ambulatory Visit: Payer: Self-pay

## 2020-10-13 ENCOUNTER — Encounter: Payer: Self-pay | Admitting: Nurse Practitioner

## 2020-10-13 ENCOUNTER — Ambulatory Visit (INDEPENDENT_AMBULATORY_CARE_PROVIDER_SITE_OTHER): Payer: No Typology Code available for payment source | Admitting: Nurse Practitioner

## 2020-10-13 VITALS — BP 122/84 | HR 86 | Temp 98.1°F | Ht 63.6 in | Wt 286.0 lb

## 2020-10-13 DIAGNOSIS — R7309 Other abnormal glucose: Secondary | ICD-10-CM | POA: Diagnosis not present

## 2020-10-13 DIAGNOSIS — I10 Essential (primary) hypertension: Secondary | ICD-10-CM | POA: Diagnosis not present

## 2020-10-13 DIAGNOSIS — Z6841 Body Mass Index (BMI) 40.0 and over, adult: Secondary | ICD-10-CM | POA: Diagnosis not present

## 2020-10-13 MED ORDER — WEGOVY 2.4 MG/0.75ML ~~LOC~~ SOAJ: 2.4000 mg | SUBCUTANEOUS | 1 refills | Status: DC

## 2020-10-13 MED FILL — WEGOVY 2.4 MG/0.75ML SOAJ: 2.4 | 28 days supply | Qty: 3 | Fill #0

## 2020-10-13 NOTE — Patient Instructions (Addendum)

## 2020-10-13 NOTE — Progress Notes (Signed)
Tomasa Hose as a scribe for Arnette Felts, FNP.,have documented all relevant documentation on the behalf of Arnette Felts, FNP,as directed by  Arnette Felts, FNP while in the presence of Arnette Felts, FNP.   This visit occurred during the SARS-CoV-2 public health emergency.  Safety protocols were in place, including screening questions prior to the visit, additional usage of staff PPE, and extensive cleaning of exam room while observing appropriate contact time as indicated for disinfecting solutions.  Subjective:     Patient ID: Sheryl Garcia , female    DOB: [birth year] , 40 y.o.   MRN: [id number]   Chief Complaint  Patient presents with  . Obesity    HPI  Patient presents today for a weight check. She is taking Wegovy 1.7 mg weekly. She is making better choices with her foods and drinking more water.  She has noticed her clothes fitting more loosely.  She is not exercising much, she is walking once a week.    Wt Readings from Last 3 Encounters: 10/13/20 : 286 lb (129.7 kg) 09/13/20 : 289 lb 12.8 oz (131.5 kg) 08/10/20 : 293 lb 3.2 oz (133 kg)    Past Medical History:  Diagnosis Date  . GERD (gastroesophageal reflux disease)      No family history on file.   Current Outpatient Medications:  .  hydrochlorothiazide (HYDRODIURIL) 12.5 MG tablet, TAKE 2 TABLETS BY MOUTH EVERY DAY, Disp: 180 tablet, Rfl: 1 .  Magnesium 200 MG TABS, Take daily with evening meal, Disp: 30 tablet, Rfl: 3 .  metFORMIN (GLUCOPHAGE) 500 MG tablet, TAKE 1 TABLET BY MOUTH EVERY DAY WITH BREAKFAST, Disp: 90 tablet, Rfl: 1 .  Semaglutide-Weight Management (WEGOVY) 2.4 MG/0.75ML SOAJ, Inject 2.4 mg into the skin once a week., Disp: 3 mL, Rfl: 1 .  Vitamin D, Ergocalciferol, (DRISDOL) 1.25 MG (50000 UNIT) CAPS capsule, Take 1 capsule (50,000 Units total) by mouth every 7 (seven) days., Disp: 12 capsule, Rfl: 0   No Known Allergies   Review of Systems  Constitutional: Negative.   HENT: Negative.    Eyes: Negative.   Respiratory: Negative.   Cardiovascular: Negative.  Negative for chest pain, palpitations and leg swelling.  Gastrointestinal: Negative.   Endocrine: Negative.   Genitourinary: Negative.   Musculoskeletal: Negative.   Skin: Negative.   Neurological: Negative.   Hematological: Negative.   Psychiatric/Behavioral: Negative.      Today's Vitals   10/13/20 0855  BP: 122/84  Pulse: 86  Temp: 98.1 F (36.7 C)  TempSrc: Oral  Weight: 286 lb (129.7 kg)  Height: 5' 3.6" (1.615 m)  PainSc: 0-No pain   Body mass index is 49.71 kg/m.   Objective:  Physical Exam Constitutional:      General: She is not in acute distress.    Appearance: Normal appearance. She is obese.  Cardiovascular:     Rate and Rhythm: Normal rate and regular rhythm.     Pulses: Normal pulses.     Heart sounds: Normal heart sounds. No murmur heard.   Pulmonary:     Effort: Pulmonary effort is normal. No respiratory distress.     Breath sounds: Normal breath sounds. No wheezing.  Musculoskeletal:        General: Normal range of motion.     Cervical back: Normal range of motion and neck supple.  Skin:    General: Skin is warm and dry.     Capillary Refill: Capillary refill takes less than 2 seconds.  Neurological:  General: No focal deficit present.     Mental Status: She is alert and oriented to person, place, and time.     Cranial Nerves: No cranial nerve deficit.  Psychiatric:        Mood and Affect: Mood normal.        Behavior: Behavior normal.        Thought Content: Thought content normal.        Judgment: Judgment normal.         Assessment And Plan:     1. Class 3 severe obesity due to excess calories without serious comorbidity with body mass index (BMI) of 45.0 to 49.9 in adult [facility])  Chronic  Discussed healthy diet and regular exercise options   Encouraged to exercise at least 150 minutes per week with 2 days of strength training  Increase her dose to 2.4  mg,sent to [facility]  Return in 2 months for weight check.  Exercise 30 minutes a day 4 days a week. Waist circumference 51.5 inches - Semaglutide-Weight Management (WEGOVY) 2.4 MG/0.75ML SOAJ; Inject 2.4 mg into the skin once a week.  Dispense: 3 mL; Refill: 1  2. Abnormal glucose  Chronic, stable  Continue to limit intake of sugary foods and drinks  3. Essential hypertension . B/P is well controlled.  . The importance of regular exercise and dietary modification was stressed to the patient.   Patient was given opportunity to ask questions. Patient verbalized understanding of the plan and was able to repeat key elements of the plan. All questions were answered to their satisfaction.  Arnette Felts, FNP    I, Arnette Felts, FNP, have reviewed all documentation for this visit. The documentation on 10/13/20 for the exam, diagnosis, procedures, and orders are all accurate and complete.   THE PATIENT IS ENCOURAGED TO PRACTICE SOCIAL DISTANCING DUE TO THE COVID-19 PANDEMIC.

## 2020-11-22 ENCOUNTER — Encounter: Payer: Self-pay | Admitting: Nurse Practitioner

## 2020-11-24 ENCOUNTER — Other Ambulatory Visit (HOSPITAL_COMMUNITY): Payer: Self-pay | Admitting: Nephrology

## 2020-11-24 ENCOUNTER — Ambulatory Visit: Payer: No Typology Code available for payment source | Admitting: Nurse Practitioner

## 2020-11-24 MED FILL — WEGOVY 2.4 MG/0.75ML SOAJ: 2.4 | 28 days supply | Qty: 3 | Fill #1

## 2020-11-24 MED FILL — AZITHROMYCIN 250 MG TABLET: 250 | 5 days supply | Qty: 6 | Fill #0

## 2020-12-12 ENCOUNTER — Ambulatory Visit: Payer: No Typology Code available for payment source | Admitting: Nurse Practitioner

## 2021-04-18 ENCOUNTER — Other Ambulatory Visit (HOSPITAL_COMMUNITY): Payer: Self-pay

## 2021-04-18 ENCOUNTER — Other Ambulatory Visit: Payer: Self-pay | Admitting: Nurse Practitioner

## 2021-04-18 DIAGNOSIS — Z6841 Body Mass Index (BMI) 40.0 and over, adult: Secondary | ICD-10-CM

## 2021-04-19 ENCOUNTER — Other Ambulatory Visit: Payer: Self-pay | Admitting: Nurse Practitioner

## 2021-04-19 ENCOUNTER — Other Ambulatory Visit (HOSPITAL_COMMUNITY): Payer: Self-pay

## 2021-04-21 ENCOUNTER — Other Ambulatory Visit (HOSPITAL_COMMUNITY): Payer: Self-pay

## 2021-04-21 MED ORDER — WEGOVY 2.4 MG/0.75ML ~~LOC~~ SOAJ
2.4000 mg | SUBCUTANEOUS | 1 refills | Status: DC
  Filled 2021-04-21: qty 3, 28d supply, fill #0

## 2021-04-27 ENCOUNTER — Encounter: Payer: Self-pay | Admitting: Nurse Practitioner

## 2021-04-27 ENCOUNTER — Other Ambulatory Visit: Payer: Self-pay

## 2021-04-27 ENCOUNTER — Ambulatory Visit: Payer: No Typology Code available for payment source | Admitting: Nurse Practitioner

## 2021-04-27 ENCOUNTER — Other Ambulatory Visit (HOSPITAL_COMMUNITY): Payer: Self-pay

## 2021-04-27 VITALS — BP 122/80 | HR 86 | Temp 98.3°F | Ht 63.6 in | Wt 290.2 lb

## 2021-04-27 DIAGNOSIS — I10 Essential (primary) hypertension: Secondary | ICD-10-CM

## 2021-04-27 DIAGNOSIS — Z6841 Body Mass Index (BMI) 40.0 and over, adult: Secondary | ICD-10-CM

## 2021-04-27 DIAGNOSIS — R7309 Other abnormal glucose: Secondary | ICD-10-CM

## 2021-04-27 MED ORDER — WEGOVY 2.4 MG/0.75ML ~~LOC~~ SOAJ
2.4000 mg | SUBCUTANEOUS | 1 refills | Status: DC
  Filled 2021-04-27: qty 3, 28d supply, fill #0

## 2021-04-27 MED ORDER — OZEMPIC (0.25 OR 0.5 MG/DOSE) 2 MG/1.5ML ~~LOC~~ SOPN
0.5000 mg | PEN_INJECTOR | SUBCUTANEOUS | 1 refills | Status: DC
  Filled 2021-04-27: qty 1.5, 28d supply, fill #0
  Filled 2021-05-01 – 2021-05-03 (×2): qty 4.5, 84d supply, fill #0

## 2021-04-27 NOTE — Progress Notes (Signed)
I,Katawbba Wiggins,acting as a Education administrator for [employer], FNP.,have documented all relevant documentation on the behalf of Minette Brine, FNP,as directed by  Minette Brine, FNP while in the presence of Minette Brine, [place].   This visit occurred during the SARS-CoV-2 public health emergency.  Safety protocols were in place, including screening questions prior to the visit, additional usage of staff PPE, and extensive cleaning of exam room while observing appropriate contact time as indicated for disinfecting solutions.  Subjective:     Patient ID: Sheryl Garcia , female    DOB: [birth year] , 40 y.o.   MRN: [id number]   Chief Complaint  Patient presents with   Obesity     HPI  The patient is here today for a follow-up on her weight.  She was taking Wegovy last seen in February.  After her last refill and missed her appt and did not have any other refills.  She reports her weight was down to 280 lbs while taking the [facility]. She is back to walking and going to [employer].  She is doing 3 times a week. Admits her diet could be better has been eating more sweets.   She had her blood pressure was elevated and was started on olmesartan, doing better now.   Wt Readings from Last 3 Encounters: 04/27/21 : 290 lb 3.2 oz (131.6 kg) 10/13/20 : 286 lb (129.7 kg) 09/13/20 : 289 lb 12.8 oz (131.5 kg)      Past Medical History:  Diagnosis Date   GERD (gastroesophageal reflux disease)      History reviewed. No pertinent family history.   Current Outpatient Medications:    Magnesium 200 MG TABS, Take daily with evening meal, Disp: 30 tablet, Rfl: 3   olmesartan (BENICAR) 20 MG tablet, Take 20 mg by mouth daily., Disp: , Rfl:    Semaglutide,0.25 or 0.5MG/DOS, (OZEMPIC, 0.25 OR 0.5 MG/DOSE,) 2 MG/1.5ML SOPN, Inject 0.5 mg into the skin once a week., Disp: 4.5 mL, Rfl: 1   azithromycin (ZITHROMAX) 250 MG tablet, TAKE 2 TABLETS BY MOUTH ON DAY 1 THEN TAKE 1 TABLET DAILY FOR THE NEXT 4 DAYS, Disp: 6  tablet, Rfl: 0   hydrochlorothiazide (HYDRODIURIL) 12.5 MG tablet, TAKE 1 TABLET BY MOUTH EVERY DAY, Disp: 90 tablet, Rfl: 1   metFORMIN (GLUCOPHAGE) 500 MG tablet, TAKE 1 TABLET BY MOUTH EVERY DAY WITH BREAKFAST, Disp: 90 tablet, Rfl: 1   Vitamin D, Ergocalciferol, (DRISDOL) 1.25 MG (50000 UNIT) CAPS capsule, Take 1 capsule (50,000 Units total) by mouth every 7 (seven) days., Disp: 12 capsule, Rfl: 0   No Known Allergies   Review of Systems  Constitutional: Negative.   Respiratory: Negative.    Cardiovascular: Negative.  Negative for chest pain, palpitations and leg swelling.  Gastrointestinal: Negative.   Neurological:  Negative for dizziness and headaches.  Psychiatric/Behavioral: Negative.    All other systems reviewed and are negative.   Today's Vitals   04/27/21 1126  BP: 122/80  Pulse: 86  Temp: 98.3 F (36.8 C)  TempSrc: Oral  Weight: 290 lb 3.2 oz (131.6 kg)  Height: 5' 3.6" (1.615 m)   Body mass index is 50.44 kg/m.  Wt Readings from Last 3 Encounters:  04/27/21 290 lb 3.2 oz (131.6 kg)  10/13/20 286 lb (129.7 kg)  09/13/20 289 lb 12.8 oz (131.5 kg)    BP Readings from Last 3 Encounters:  04/27/21 122/80  10/13/20 122/84  09/13/20 120/82    Objective:  Physical Exam Vitals reviewed.  Constitutional:  General: She is not in acute distress.    Appearance: Normal appearance. She is obese.  Cardiovascular:     Rate and Rhythm: Normal rate and regular rhythm.     Pulses: Normal pulses.     Heart sounds: Normal heart sounds. No murmur heard. Pulmonary:     Effort: Pulmonary effort is normal. No respiratory distress.     Breath sounds: Normal breath sounds. No wheezing.  Musculoskeletal:        General: Normal range of motion.     Cervical back: Normal range of motion and neck supple.  Skin:    General: Skin is warm and dry.     Capillary Refill: Capillary refill takes less than 2 seconds.  Neurological:     General: No focal deficit present.      Mental Status: She is alert and oriented to person, place, and time.     Cranial Nerves: No cranial nerve deficit.     Motor: No weakness.  Psychiatric:        Mood and Affect: Mood normal.        Behavior: Behavior normal.        Thought Content: Thought content normal.        Judgment: Judgment normal.        Assessment And Plan:     1. Essential hypertension Comments: Excellent control, no changes  2. Abnormal glucose Comments: Will try her on Ozempic pending insurance coverage. focus on healthy diet low in sugar and starches. - Hemoglobin A1c - Insulin, random  3. Class 3 severe obesity due to excess calories without serious comorbidity with body mass index (BMI) of 50.0 to 59.9 in adult [facility]) Will check for insulin resistance Focus on healthy diet and regular physical activity - BMP8+eGFR - Insulin, random - Semaglutide,0.25 or 0.5MG/DOS, (OZEMPIC, 0.25 OR 0.5 MG/DOSE,) 2 MG/1.5ML SOPN; Inject 0.5 mg into the skin once a week.  Dispense: 4.5 mL; Refill: 1     Patient was given opportunity to ask questions. Patient verbalized understanding of the plan and was able to repeat key elements of the plan. All questions were answered to their satisfaction.  Minette Brine, FNP   I, Minette Brine, FNP, have reviewed all documentation for this visit. The documentation on 04/27/21 for the exam, diagnosis, procedures, and orders are all accurate and complete.   IF YOU HAVE BEEN REFERRED TO A SPECIALIST, IT MAY TAKE 1-2 WEEKS TO SCHEDULE/PROCESS THE REFERRAL. IF YOU HAVE NOT HEARD FROM US/SPECIALIST IN TWO WEEKS, PLEASE GIVE [place] A CALL AT [phone].   THE PATIENT IS ENCOURAGED TO PRACTICE SOCIAL DISTANCING DUE TO THE COVID-19 PANDEMIC.

## 2021-04-27 NOTE — Patient Instructions (Addendum)
[email or web address] - Dr. Harley Hallmark 3 tips with intermittent fasting   Exercise at least 150 minutes a week start slow and work your way up [employer].

## 2021-04-28 LAB — BMP8+EGFR
BUN/Creatinine Ratio: 17 (ref 9–23)
BUN: 12 mg/dL (ref 6–24)
CO2: 24 mmol/L (ref 20–29)
Calcium: 11.4 mg/dL — ABNORMAL HIGH (ref 8.7–10.2)
Chloride: 102 mmol/L (ref 96–106)
Creatinine, Ser: 0.69 mg/dL (ref 0.57–1.00)
Glucose: 102 mg/dL — ABNORMAL HIGH (ref 65–99)
Potassium: 4.4 mmol/L (ref 3.5–5.2)
Sodium: 138 mmol/L (ref 134–144)
eGFR: 111 mL/min/{1.73_m2} (ref >59)

## 2021-04-28 LAB — INSULIN, RANDOM: INSULIN: 62.8 u[IU]/mL — ABNORMAL HIGH (ref 2.6–24.9)

## 2021-04-28 LAB — HEMOGLOBIN A1C
Est. average glucose Bld gHb Est-mCnc: 128 mg/dL
Hgb A1c MFr Bld: 6.1 % — ABNORMAL HIGH (ref 4.8–5.6)

## 2021-05-01 ENCOUNTER — Encounter: Payer: Self-pay | Admitting: Nurse Practitioner

## 2021-05-01 ENCOUNTER — Other Ambulatory Visit (HOSPITAL_COMMUNITY): Payer: Self-pay

## 2021-05-01 ENCOUNTER — Other Ambulatory Visit: Payer: Self-pay

## 2021-05-01 ENCOUNTER — Other Ambulatory Visit: Payer: Self-pay | Admitting: Nurse Practitioner

## 2021-05-01 DIAGNOSIS — R7309 Other abnormal glucose: Secondary | ICD-10-CM

## 2021-05-01 DIAGNOSIS — Z6841 Body Mass Index (BMI) 40.0 and over, adult: Secondary | ICD-10-CM

## 2021-05-01 MED ORDER — METFORMIN HCL 500 MG PO TABS: ORAL_TABLET | ORAL | 1 refills | Status: DC

## 2021-05-03 ENCOUNTER — Other Ambulatory Visit (HOSPITAL_COMMUNITY): Payer: Self-pay

## 2021-05-09 ENCOUNTER — Encounter: Payer: Self-pay | Admitting: Nurse Practitioner

## 2021-05-22 ENCOUNTER — Other Ambulatory Visit: Payer: Self-pay | Admitting: Nurse Practitioner

## 2021-05-22 DIAGNOSIS — I10 Essential (primary) hypertension: Secondary | ICD-10-CM

## 2021-05-29 ENCOUNTER — Other Ambulatory Visit: Payer: Self-pay | Admitting: Nurse Practitioner

## 2021-05-29 ENCOUNTER — Encounter: Payer: Self-pay | Admitting: Nurse Practitioner

## 2021-05-29 DIAGNOSIS — R7309 Other abnormal glucose: Secondary | ICD-10-CM

## 2021-05-29 MED ORDER — MOUNJARO 5 MG/0.5ML ~~LOC~~ SOAJ: 5.0000 mg | SUBCUTANEOUS | 1 refills | Status: DC

## 2021-06-22 ENCOUNTER — Encounter: Payer: Self-pay | Admitting: Nurse Practitioner

## 2021-06-22 ENCOUNTER — Ambulatory Visit: Payer: No Typology Code available for payment source | Admitting: Nurse Practitioner

## 2021-06-22 ENCOUNTER — Other Ambulatory Visit: Payer: Self-pay

## 2021-06-22 VITALS — BP 122/80 | HR 90 | Temp 98.1°F | Ht 63.0 in | Wt 291.8 lb

## 2021-06-22 DIAGNOSIS — Z6841 Body Mass Index (BMI) 40.0 and over, adult: Secondary | ICD-10-CM | POA: Diagnosis not present

## 2021-06-22 DIAGNOSIS — I10 Essential (primary) hypertension: Secondary | ICD-10-CM

## 2021-06-22 DIAGNOSIS — R7309 Other abnormal glucose: Secondary | ICD-10-CM | POA: Diagnosis not present

## 2021-06-22 NOTE — Progress Notes (Signed)
I,Victoria Hamilton,acting as a Education administrator for Minette Brine, FNP.,have documented all relevant documentation on the behalf of Minette Brine, FNP,as directed by  Minette Brine, FNP while in the presence of Minette Brine, [place].   This visit occurred during the SARS-CoV-2 public health emergency.  Safety protocols were in place, including screening questions prior to the visit, additional usage of staff PPE, and extensive cleaning of exam room while observing appropriate contact time as indicated for disinfecting solutions.  Subjective:     Patient ID: Sheryl Garcia , female    DOB: 12-10-1980 , 40 y.o.   MRN: [id number]   Chief Complaint  Patient presents with   Weight Check    HPI  Pt here for medication check [name]). She has no specific concerns or questions at the moment. She can tell a difference in appetite, it is curbed. She is cutting back on sugar, increasing her water intake. A few days a week will eat mac and cheese, trying to eat more vegetables, salad, notices she is not craving the sugar as much anymore. She has not had any nausea. She is having more bowel movements.   Wt Readings from Last 3 Encounters: 06/22/21 : 291 lb 12.8 oz (132.4 kg) 04/27/21 : 290 lb 3.2 oz (131.6 kg) 10/13/20 : 286 lb (129.7 kg)      Past Medical History:  Diagnosis Date   GERD (gastroesophageal reflux disease)      No family history on file.   Current Outpatient Medications:    hydrochlorothiazide (HYDRODIURIL) 12.5 MG tablet, TAKE 1 TABLET BY MOUTH EVERY DAY, Disp: 90 tablet, Rfl: 1   Magnesium 200 MG TABS, Take daily with evening meal, Disp: 30 tablet, Rfl: 3   metFORMIN (GLUCOPHAGE) 500 MG tablet, TAKE 1 TABLET BY MOUTH EVERY DAY WITH BREAKFAST, Disp: 90 tablet, Rfl: 1   olmesartan (BENICAR) 20 MG tablet, Take 20 mg by mouth daily., Disp: , Rfl:    tirzepatide (MOUNJARO) 5 MG/0.5ML Pen, Inject 5 mg into the skin once a week., Disp: 84 mL, Rfl: 1   azithromycin (ZITHROMAX) 250 MG tablet, TAKE  2 TABLETS BY MOUTH ON DAY 1 THEN TAKE 1 TABLET DAILY FOR THE NEXT 4 DAYS, Disp: 6 tablet, Rfl: 0   Vitamin D, Ergocalciferol, (DRISDOL) 1.25 MG (50000 UNIT) CAPS capsule, Take 1 capsule (50,000 Units total) by mouth every 7 (seven) days., Disp: 12 capsule, Rfl: 0   No Known Allergies   Review of Systems  Constitutional: Negative.   Respiratory: Negative.    Cardiovascular: Negative.  Negative for chest pain, palpitations and leg swelling.  Neurological: Negative.   Psychiatric/Behavioral: Negative.      Today's Vitals   06/22/21 1123  BP: 122/80  Pulse: 90  Temp: 98.1 F (36.7 C)  Weight: 291 lb 12.8 oz (132.4 kg)  Height: _0  (1.6 m)  PainSc: 0-No pain   Body mass index is 51.69 kg/m.  Wt Readings from Last 3 Encounters:  06/22/21 291 lb 12.8 oz (132.4 kg)  04/27/21 290 lb 3.2 oz (131.6 kg)  10/13/20 286 lb (129.7 kg)    Objective:  Physical Exam Vitals reviewed.  Constitutional:      General: She is not in acute distress.    Appearance: Normal appearance. She is obese.  Cardiovascular:     Rate and Rhythm: Normal rate and regular rhythm.     Pulses: Normal pulses.     Heart sounds: Normal heart sounds. No murmur heard. Pulmonary:     Effort: Pulmonary  effort is normal. No respiratory distress.     Breath sounds: Normal breath sounds. No wheezing.  Musculoskeletal:        General: Normal range of motion.     Cervical back: Normal range of motion and neck supple.  Skin:    General: Skin is warm and dry.     Capillary Refill: Capillary refill takes less than 2 seconds.  Neurological:     General: No focal deficit present.     Mental Status: She is alert and oriented to person, place, and time.     Cranial Nerves: No cranial nerve deficit.     Motor: No weakness.  Psychiatric:        Mood and Affect: Mood normal.        Behavior: Behavior normal.        Thought Content: Thought content normal.        Judgment: Judgment normal.        Assessment And Plan:      1. Essential hypertension Comments: Blood pressure is well controlled   2. Abnormal glucose Comments: Continue metformin, will add mounjaro due to not much improvement pending insurance approval  3. Class 3 severe obesity due to excess calories without serious comorbidity with body mass index (BMI) of 50.0 to 59.9 in adult [facility]) Comments: Encouraged to exercise regularly with at least 150 minutes of physical activity and to eat a healthy diet     Patient was given opportunity to ask questions. Patient verbalized understanding of the plan and was able to repeat key elements of the plan. All questions were answered to their satisfaction.  Minette Brine, FNP   I, Minette Brine, FNP, have reviewed all documentation for this visit. The documentation on 06/28/21 for the exam, diagnosis, procedures, and orders are all accurate and complete.   IF YOU HAVE BEEN REFERRED TO A SPECIALIST, IT MAY TAKE 1-2 WEEKS TO SCHEDULE/PROCESS THE REFERRAL. IF YOU HAVE NOT HEARD FROM US/SPECIALIST IN TWO WEEKS, PLEASE GIVE [place] A CALL AT [phone].   THE PATIENT IS ENCOURAGED TO PRACTICE SOCIAL DISTANCING DUE TO THE COVID-19 PANDEMIC.

## 2021-06-22 NOTE — Patient Instructions (Signed)

## 2021-06-28 ENCOUNTER — Encounter: Payer: Self-pay | Admitting: Nurse Practitioner

## 2021-07-06 ENCOUNTER — Ambulatory Visit: Admit: 2021-07-06 | Payer: 59 | Admitting: Orthopedic Surgery

## 2021-07-06 SURGERY — CARPAL TUNNEL RELEASE
Anesthesia: Choice | Laterality: Left

## 2021-08-15 ENCOUNTER — Encounter: Payer: No Typology Code available for payment source | Admitting: Nurse Practitioner

## 2021-08-15 ENCOUNTER — Encounter: Payer: Self-pay | Admitting: Nurse Practitioner

## 2021-08-16 ENCOUNTER — Ambulatory Visit (INDEPENDENT_AMBULATORY_CARE_PROVIDER_SITE_OTHER): Payer: No Typology Code available for payment source | Admitting: Nurse Practitioner

## 2021-08-16 ENCOUNTER — Encounter: Payer: Self-pay | Admitting: Nurse Practitioner

## 2021-08-16 ENCOUNTER — Other Ambulatory Visit: Payer: Self-pay

## 2021-08-16 VITALS — BP 140/80 | HR 67 | Temp 98.1°F | Ht 63.0 in | Wt 279.6 lb

## 2021-08-16 DIAGNOSIS — R7309 Other abnormal glucose: Secondary | ICD-10-CM | POA: Diagnosis not present

## 2021-08-16 DIAGNOSIS — E559 Vitamin D deficiency, unspecified: Secondary | ICD-10-CM

## 2021-08-16 DIAGNOSIS — E7800 Pure hypercholesterolemia, unspecified: Secondary | ICD-10-CM

## 2021-08-16 DIAGNOSIS — Z0000 Encounter for general adult medical examination without abnormal findings: Secondary | ICD-10-CM

## 2021-08-16 DIAGNOSIS — I10 Essential (primary) hypertension: Secondary | ICD-10-CM | POA: Diagnosis not present

## 2021-08-16 DIAGNOSIS — Z6841 Body Mass Index (BMI) 40.0 and over, adult: Secondary | ICD-10-CM

## 2021-08-16 LAB — POCT URINALYSIS DIPSTICK
Bilirubin, UA: NEGATIVE
Blood, UA: NEGATIVE
Glucose, UA: NEGATIVE
Ketones, UA: NEGATIVE
Leukocytes, UA: NEGATIVE
Nitrite, UA: NEGATIVE
Protein, UA: NEGATIVE
Spec Grav, UA: 1.025 (ref 1.010–1.025)
Urobilinogen, UA: 0.2 E.U./dL
pH, UA: 5.5 (ref 5.0–8.0)

## 2021-08-16 LAB — POCT UA - MICROALBUMIN
Albumin/Creatinine Ratio, Urine, POC: <30
Creatinine, POC: 300 mg/dL
Microalbumin Ur, POC: 30 mg/L

## 2021-08-16 MED ORDER — MOUNJARO 7.5 MG/0.5ML ~~LOC~~ SOAJ: 7.5000 mg | SUBCUTANEOUS | 0 refills | Status: DC

## 2021-08-16 NOTE — Progress Notes (Signed)
I,Tianna Badgett,acting as a Education administrator for [employer], NP.,have documented all relevant documentation on the behalf of [employer], NP,as directed by  Bary Castilla, NP while in the presence of Bary Castilla, NP.  This visit occurred during the SARS-CoV-2 public health emergency.  Safety protocols were in place, including screening questions prior to the visit, additional usage of staff PPE, and extensive cleaning of exam room while observing appropriate contact time as indicated for disinfecting solutions.  Subjective:     Patient ID: Sheryl Garcia , female    DOB: Jan 23, 1981 , 40 y.o.   MRN: [id number]   Chief Complaint  Patient presents with   Annual Exam    HPI  Patient here for HM. Patient goes to [facility]  She is taking Manjuaro. She is doing well with it  Exercise: She is not exercising  Diet: she is doing intermediate fasting.  Drink or smoke: No  Sexually active: yes  Her LMP was 07/12/21 She had a mammogram this year.   Wt Readings from Last 3 Encounters: 08/16/21 : 279 lb 9.6 oz (126.8 kg) 06/22/21 : 291 lb 12.8 oz (132.4 kg) 04/27/21 : 290 lb 3.2 oz (131.6 kg)      Past Medical History:  Diagnosis Date   GERD (gastroesophageal reflux disease)      No family history on file.   Current Outpatient Medications:    tirzepatide (MOUNJARO) 7.5 MG/0.5ML Pen, Inject 7.5 mg into the skin once a week., Disp: 6 mL, Rfl: 0   hydrochlorothiazide (HYDRODIURIL) 12.5 MG tablet, TAKE 1 TABLET BY MOUTH EVERY DAY, Disp: 90 tablet, Rfl: 1   Magnesium 200 MG TABS, Take daily with evening meal (Patient taking differently: Take daily with evening meal. Pt taking 566m daily), Disp: 30 tablet, Rfl: 3   metFORMIN (GLUCOPHAGE) 500 MG tablet, TAKE 1 TABLET BY MOUTH EVERY DAY WITH BREAKFAST, Disp: 90 tablet, Rfl: 1   olmesartan (BENICAR) 20 MG tablet, Take 20 mg by mouth daily., Disp: , Rfl:    No Known Allergies    The patient states she uses none for  birth control. Last LMP was No LMP recorded. (Menstrual status: Irregular Periods).. Negative for Dysmenorrhea. Negative for: breast discharge, breast lump(s), breast pain and breast self exam. Associated symptoms include abnormal vaginal bleeding. Pertinent negatives include abnormal bleeding (hematology), anxiety, decreased libido, depression, difficulty falling sleep, dyspareunia, history of infertility, nocturia, sexual dysfunction, sleep disturbances, urinary incontinence, urinary urgency, vaginal discharge and vaginal itching. Diet regular.The patient states her exercise level is    . The patient's tobacco use is:  Social History   Tobacco Use  Smoking Status Never  Smokeless Tobacco Never  . She has been exposed to passive smoke. The patient's alcohol use is:  Social History   Substance and Sexual Activity  Alcohol Use No  . Additional information: Last pap 2021, next one scheduled for 2024.    Review of Systems  Constitutional: Negative.  Negative for chills and fever.  HENT: Negative.  Negative for congestion and rhinorrhea.   Eyes: Negative.  Negative for visual disturbance.  Respiratory: Negative.  Negative for cough, shortness of breath and wheezing.   Cardiovascular: Negative.  Negative for chest pain.  Gastrointestinal: Negative.  Negative for constipation, diarrhea, nausea and vomiting.  Endocrine: Negative.  Negative for polydipsia, polyphagia and polyuria.  Genitourinary: Negative.   Musculoskeletal: Negative.  Negative for arthralgias and myalgias.  Skin: Negative.   Allergic/Immunologic: Negative.   Neurological: Negative.  Negative for dizziness and weakness.  Hematological: Negative.   Psychiatric/Behavioral: Negative.      Today's Vitals   08/16/21 0938  BP: 140/80  Pulse: 67  Temp: 98.1 F (36.7 C)  TempSrc: Oral  Weight: 279 lb 9.6 oz (126.8 kg)  Height: _0  (1.6 m)   Body mass index is 49.53 kg/m.  Wt Readings from Last 3 Encounters:  08/16/21 279  lb 9.6 oz (126.8 kg)  06/22/21 291 lb 12.8 oz (132.4 kg)  04/27/21 290 lb 3.2 oz (131.6 kg)    Objective:  Physical Exam Vitals and nursing note reviewed.  Constitutional:      Appearance: Normal appearance.  HENT:     Head: Normocephalic and atraumatic.     Right Ear: Tympanic membrane, ear canal and external ear normal.     Left Ear: Tympanic membrane, ear canal and external ear normal.     Nose:     Comments: Deferred. Masked     Mouth/Throat:     Comments: Deferred. Masked  Eyes:     Extraocular Movements: Extraocular movements intact.     Conjunctiva/sclera: Conjunctivae normal.     Pupils: Pupils are equal, round, and reactive to light.  Cardiovascular:     Rate and Rhythm: Normal rate and regular rhythm.     Pulses: Normal pulses.     Heart sounds: Normal heart sounds.  Pulmonary:     Effort: No respiratory distress.     Breath sounds: Normal breath sounds. No wheezing.  Chest:  Breasts:    Tanner Score is 5.     Right: Normal.     Left: Normal.  Abdominal:     General: Abdomen is flat. Bowel sounds are normal.     Palpations: Abdomen is soft.  Genitourinary:    Comments: deferred Musculoskeletal:        General: Normal range of motion.     Cervical back: Normal range of motion and neck supple.  Skin:    General: Skin is warm and dry.     Capillary Refill: Capillary refill takes less than 2 seconds.  Neurological:     General: No focal deficit present.     Mental Status: She is alert and oriented to person, place, and time.  Psychiatric:        Mood and Affect: Mood normal.        Behavior: Behavior normal.        Assessment And Plan:     1. Encounter for annual physical exam -Patient is here for their annual physical exam and we discussed any changes to medication and medical history.  -Behavior modification was discussed as well as diet and exercise history  -Patient will continue to exercise regularly and modify their diet.  -Recommendation for  yearly physical annuals, immunization and screenings including mammogram and colonoscopy were discussed with the patient.  -Recommended intake of multivitamin, vitamin D and calcium.  -Individualized advise was given to the patient pertaining to their own health history in regards to diet, exercise, medical condition and referrals.   2. Essential hypertension -Chronic, stable -Continue meds  - EKG 12-Lead - POCT Urinalysis Dipstick (81002) - POCT UA - Microalbumin - CBC - CMP14+EGFR  3. Abnormal glucose -Chronic, stable, continue meds  --Discussed with patient the importance of glycemic control and long term complications from uncontrolled diabetes. Discussed with the patient the importance of compliance with home glucose monitoring, diet which includes decrease amount of sugary drinks and foods. Importance of exercise was also discussed with the patient. Importance  of eye exams, self foot care and compliance to office visits was also discussed with the patient.  -Will increase her Manjuaro to 7.5 mg she is doing well with it and does not complain of any SE.  - Hemoglobin A1c - tirzepatide (MOUNJARO) 7.5 MG/0.5ML Pen; Inject 7.5 mg into the skin once a week.  Dispense: 6 mL; Refill: 0  4. Vitamin D deficiency -Will check and supplement  - Vitamin D (25 hydroxy)  5. Elevated cholesterol -Will check and assess  - Lipid panel  6. Class 3 severe obesity due to excess calories without serious comorbidity with body mass index (BMI) of 45.0 to 49.9 in adult [facility]) -Advised patient on a healthy diet including avoiding fast food and red meats. Increase the intake of lean meats including grilled chicken and [place].  Drink a lot of water. Decrease intake of fatty foods. Exercise for 30-45 min. 4-5 a week to decrease the risk of cardiac event.  -Increased Mounjaro to 7.5 mg. She is doing well with it. No SE reported  - tirzepatide (MOUNJARO) 7.5 MG/0.5ML Pen; Inject 7.5 mg into the skin once a  week.  Dispense: 6 mL; Refill: 0   The patient was encouraged to call or send a message through [place] for any questions or concerns.   Follow up: 1 month   Side effects and appropriate use of all the medication(s) were discussed with the patient today. Patient advised to use the medication(s) as directed by their healthcare provider. The patient was encouraged to read, review, and understand all associated package inserts and contact our office with any questions or concerns. The patient accepts the risks of the treatment plan and had an opportunity to ask questions.   Staying healthy and adopting a healthy lifestyle for your overall health is important. You should eat 7 or more servings of fruits and vegetables per day. You should drink plenty of water to keep yourself hydrated and your kidneys healthy. This includes about 65-80+ fluid ounces of water. Limit your intake of animal fats especially for elevated cholesterol. Avoid highly processed food and limit your salt intake if you have hypertension. Avoid foods high in saturated/Trans fats. Along with a healthy diet it is also very important to maintain time for yourself to maintain a healthy mental health with low stress levels. You should get atleast 150 min of moderate intensity exercise weekly for a healthy heart. Along with eating right and exercising, aim for at least 7-9 hours of sleep daily.  Eat more whole grains which includes barley, wheat berries, oats, brown rice and whole wheat pasta. Use healthy plant oils which include olive, soy, corn, sunflower and peanut. Limit your caffeine and sugary drinks. Limit your intake of fast foods. Limit milk and dairy products to one or two daily servings.   Patient was given opportunity to ask questions. Patient verbalized understanding of the plan and was able to repeat key elements of the plan. All questions were answered to their satisfaction.  Sheryl Zaryiah Barz, DNP   I, Sheryl Garcia have reviewed  all documentation for this visit. The documentation on 08/16/21 for the exam, diagnosis, procedures, and orders are all accurate and complete.    THE PATIENT IS ENCOURAGED TO PRACTICE SOCIAL DISTANCING DUE TO THE COVID-19 PANDEMIC.

## 2021-08-16 NOTE — Patient Instructions (Signed)

## 2021-08-17 LAB — CMP14+EGFR
ALT: 18 IU/L (ref 0–32)
AST: 13 IU/L (ref 0–40)
Albumin/Globulin Ratio: 1.1 — ABNORMAL LOW (ref 1.2–2.2)
Albumin: 4 g/dL (ref 3.8–4.8)
Alkaline Phosphatase: 76 IU/L (ref 44–121)
BUN/Creatinine Ratio: 15 (ref 9–23)
BUN: 11 mg/dL (ref 6–24)
Bilirubin Total: 0.3 mg/dL (ref 0.0–1.2)
CO2: 21 mmol/L (ref 20–29)
Calcium: 10.8 mg/dL — ABNORMAL HIGH (ref 8.7–10.2)
Chloride: 103 mmol/L (ref 96–106)
Creatinine, Ser: 0.74 mg/dL (ref 0.57–1.00)
Globulin, Total: 3.6 g/dL (ref 1.5–4.5)
Glucose: 77 mg/dL (ref 70–99)
Potassium: 4.6 mmol/L (ref 3.5–5.2)
Sodium: 135 mmol/L (ref 134–144)
Total Protein: 7.6 g/dL (ref 6.0–8.5)
eGFR: 104 mL/min/{1.73_m2} (ref >59)

## 2021-08-17 LAB — LIPID PANEL
Chol/HDL Ratio: 3.8 ratio (ref 0.0–4.4)
Cholesterol, Total: 193 mg/dL (ref 100–199)
HDL: 51 mg/dL (ref >39)
LDL Chol Calc (NIH): 128 mg/dL — ABNORMAL HIGH (ref 0–99)
Triglycerides: 77 mg/dL (ref 0–149)
VLDL Cholesterol Cal: 14 mg/dL (ref 5–40)

## 2021-08-17 LAB — CBC
Hematocrit: 37.3 % (ref 34.0–46.6)
Hemoglobin: 12.2 g/dL (ref 11.1–15.9)
MCH: 25.5 pg — ABNORMAL LOW (ref 26.6–33.0)
MCHC: 32.7 g/dL (ref 31.5–35.7)
MCV: 78 fL — ABNORMAL LOW (ref 79–97)
Platelets: 428 10*3/uL (ref 150–450)
RBC: 4.79 x10E6/uL (ref 3.77–5.28)
RDW: 14.3 % (ref 11.7–15.4)
WBC: 8.6 10*3/uL (ref 3.4–10.8)

## 2021-08-17 LAB — HEMOGLOBIN A1C
Est. average glucose Bld gHb Est-mCnc: 123 mg/dL
Hgb A1c MFr Bld: 5.9 % — ABNORMAL HIGH (ref 4.8–5.6)

## 2021-08-17 LAB — VITAMIN D 25 HYDROXY (VIT D DEFICIENCY, FRACTURES): Vit D, 25-Hydroxy: 28.6 ng/mL — ABNORMAL LOW (ref 30.0–100.0)

## 2021-08-20 ENCOUNTER — Other Ambulatory Visit: Payer: Self-pay | Admitting: Nurse Practitioner

## 2021-08-20 DIAGNOSIS — E559 Vitamin D deficiency, unspecified: Secondary | ICD-10-CM

## 2021-08-20 MED ORDER — VITAMIN D (ERGOCALCIFEROL) 1.25 MG (50000 UNIT) PO CAPS: 50000.0000 [IU] | ORAL_CAPSULE | ORAL | 0 refills | Status: DC

## 2021-08-27 ENCOUNTER — Emergency Department (HOSPITAL_COMMUNITY)
Admission: EM | Admit: 2021-08-27 | Discharge: 2021-08-27 | Disposition: A | Discharge status: Home / Self Care | Payer: No Typology Code available for payment source | Attending: Emergency Medicine | Admitting: Emergency Medicine

## 2021-08-27 ENCOUNTER — Other Ambulatory Visit: Payer: Self-pay

## 2021-08-27 ENCOUNTER — Encounter (HOSPITAL_COMMUNITY): Payer: Self-pay | Admitting: Emergency Medicine

## 2021-08-27 ENCOUNTER — Emergency Department (HOSPITAL_COMMUNITY): Payer: No Typology Code available for payment source

## 2021-08-27 DIAGNOSIS — I10 Essential (primary) hypertension: Secondary | ICD-10-CM | POA: Insufficient documentation

## 2021-08-27 DIAGNOSIS — Z7984 Long term (current) use of oral hypoglycemic drugs: Secondary | ICD-10-CM | POA: Diagnosis not present

## 2021-08-27 DIAGNOSIS — R0981 Nasal congestion: Secondary | ICD-10-CM | POA: Insufficient documentation

## 2021-08-27 DIAGNOSIS — Z79899 Other long term (current) drug therapy: Secondary | ICD-10-CM | POA: Diagnosis not present

## 2021-08-27 DIAGNOSIS — R519 Headache, unspecified: Secondary | ICD-10-CM | POA: Insufficient documentation

## 2021-08-27 DIAGNOSIS — Z20822 Contact with and (suspected) exposure to covid-19: Secondary | ICD-10-CM | POA: Diagnosis not present

## 2021-08-27 DIAGNOSIS — J3489 Other specified disorders of nose and nasal sinuses: Secondary | ICD-10-CM | POA: Insufficient documentation

## 2021-08-27 LAB — RESP PANEL BY RT-PCR (FLU A&B, COVID) ARPGX2
Influenza A by PCR: NEGATIVE
Influenza B by PCR: NEGATIVE
SARS Coronavirus 2 by RT PCR: NEGATIVE

## 2021-08-27 MED ORDER — DEXAMETHASONE SODIUM PHOSPHATE 10 MG/ML IJ SOLN
10.0000 mg | Freq: Once | INTRAMUSCULAR | Status: AC
  Administered 2021-08-27: 12:00:00 10 mg via INTRAMUSCULAR
  Filled 2021-08-27: qty 1

## 2021-08-27 MED ORDER — METOCLOPRAMIDE HCL 10 MG PO TABS
10.0000 mg | ORAL_TABLET | Freq: Once | ORAL | Status: AC
  Administered 2021-08-27: 11:00:00 10 mg via ORAL
  Filled 2021-08-27: qty 1

## 2021-08-27 MED ORDER — OXYCODONE-ACETAMINOPHEN 5-325 MG PO TABS
2.0000 | ORAL_TABLET | Freq: Once | ORAL | Status: AC
  Administered 2021-08-27: 14:00:00 2 via ORAL
  Filled 2021-08-27: qty 2

## 2021-08-27 MED ORDER — KETOROLAC TROMETHAMINE 60 MG/2ML IM SOLN
60.0000 mg | Freq: Once | INTRAMUSCULAR | Status: AC
  Administered 2021-08-27: 12:00:00 60 mg via INTRAMUSCULAR
  Filled 2021-08-27: qty 2

## 2021-08-27 MED ORDER — IBUPROFEN 600 MG PO TABS: 600.0000 mg | ORAL_TABLET | Freq: Four times a day (QID) | ORAL | 0 refills | Status: DC | PRN

## 2021-08-27 NOTE — ED Provider Notes (Signed)
[facility] DEPT Provider Note   CSN: [id number] Arrival date & time: 08/27/21  1022     History Chief Complaint  Patient presents with   Headache    Sheryl Garcia is a 40 y.o. female with a past medical history of hypertension who presents to the ED complaining of left-sided headache x3 days.  She has intermittent sharp, shooting sensation to the left temporal region that occurs every 60 seconds.  Patient has sick contacts of her nephew who had a viral illness 1 week ago.  She has associated rhinorrhea, nasal congestion.  Patient has a history of headaches.  She tried ibuprofen with no relief of her symptoms.  Denies vision changes, vision loss, ear pain, sore throat, fever, chills, abdominal pain.   The history is provided by the patient. No language interpreter was used.      Past Medical History:  Diagnosis Date   GERD (gastroesophageal reflux disease)     Patient Active Problem List   Diagnosis Date Noted   Essential hypertension 04/06/2020   OBESITY, NOS 10/31/2006   GASTROESOPHAGEAL REFLUX, NO ESOPHAGITIS 10/31/2006    History reviewed. No pertinent surgical history.   OB History   No obstetric history on file.     No family history on file.  Social History   Tobacco Use   Smoking status: Never   Smokeless tobacco: Never  Substance Use Topics   Alcohol use: No   Drug use: No    Home Medications Prior to Admission medications   Medication Sig Start Date End Date Taking? Authorizing Provider  ibuprofen (ADVIL) 600 MG tablet Take 1 tablet (600 mg total) by mouth every 6 (six) hours as needed. 08/27/21  Yes Dain Laseter A, PA-C  hydrochlorothiazide (HYDRODIURIL) 12.5 MG tablet TAKE 1 TABLET BY MOUTH EVERY DAY 05/24/21   Minette Brine, FNP  Magnesium 200 MG TABS Take daily with evening meal Patient taking differently: Take daily with evening meal. Pt taking 500mg  daily 02/04/20   Minette Brine, FNP  metFORMIN (GLUCOPHAGE) 500  MG tablet TAKE 1 TABLET BY MOUTH EVERY DAY WITH BREAKFAST 05/01/21   Minette Brine, FNP  olmesartan (BENICAR) 20 MG tablet Take 20 mg by mouth daily. 02/07/21   [provider]  tirzepatide [name]) 7.5 MG/0.5ML Pen Inject 7.5 mg into the skin once a week. 08/16/21   Bary Castilla, NP  Vitamin D, Ergocalciferol, (DRISDOL) 1.25 MG (50000 UNIT) CAPS capsule Take 1 capsule (50,000 Units total) by mouth every 7 (seven) days. 08/20/21   Bary Castilla, NP    Allergies    Patient has no known allergies.  Review of Systems   Review of Systems  Constitutional:  Negative for chills and fever.  HENT:  Positive for congestion and rhinorrhea. Negative for ear pain and sore throat.   Eyes:  Negative for visual disturbance.  Respiratory:  Negative for shortness of breath.   Cardiovascular:  Negative for chest pain.  Gastrointestinal:  Negative for abdominal pain, nausea and vomiting.  Skin:  Negative for rash.  Neurological:  Positive for headaches. Negative for dizziness and light-headedness.  All other systems reviewed and are negative.  Physical Exam Updated Vital Signs BP (!) 127/50 (BP Location: Left Arm)    Pulse 71    Temp 98.3 F (36.8 C) (Oral)    Resp 18    Ht 5\' 3"  (1.6 m)    Wt 127 kg    LMP 08/14/2021    SpO2 100%    BMI  49.60 kg/m   Physical Exam Vitals and nursing note reviewed.  Constitutional:      General: She is not in acute distress.    Appearance: She is not diaphoretic.  HENT:     Head: Normocephalic and atraumatic.     Comments: Mild tenderness noted to left temporal region.  No overlying skin changes.  No maxillary or frontal sinus tenderness to palpation.    Right Ear: Tympanic membrane, ear canal and external ear normal.     Left Ear: Tympanic membrane, ear canal and external ear normal.     Nose: Nose normal.     Mouth/Throat:     Mouth: Mucous membranes are moist.     Pharynx: Oropharynx is clear. No oropharyngeal exudate.  Eyes:     General:  No scleral icterus.    Conjunctiva/sclera: Conjunctivae normal.  Cardiovascular:     Rate and Rhythm: Normal rate and regular rhythm.     Pulses: Normal pulses.     Heart sounds: Normal heart sounds.  Pulmonary:     Effort: Pulmonary effort is normal. No respiratory distress.     Breath sounds: Normal breath sounds. No wheezing.  Abdominal:     General: Bowel sounds are normal.     Palpations: Abdomen is soft. There is no mass.     Tenderness: There is no abdominal tenderness. There is no guarding or rebound.  Musculoskeletal:        General: Normal range of motion.     Cervical back: Normal range of motion and neck supple.     Comments: Strength and sensation intact to bilateral upper and lower extremities.  Able to ambulate without assistance or difficulty.  Skin:    General: Skin is warm and dry.  Neurological:     Mental Status: She is alert.  Psychiatric:        Behavior: Behavior normal.    ED Results / Procedures / Treatments   Labs (all labs ordered are listed, but only abnormal results are displayed) Labs Reviewed  RESP PANEL BY RT-PCR (FLU A&B, COVID) ARPGX2    EKG None  Radiology CT Head Wo Contrast  Result Date: 08/27/2021 CLINICAL DATA:  Left-sided headache EXAM: CT HEAD WITHOUT CONTRAST TECHNIQUE: Contiguous axial images were obtained from the base of the skull through the vertex without intravenous contrast. COMPARISON:  None. FINDINGS: Brain: No evidence of acute infarction, hemorrhage, hydrocephalus, extra-axial collection or mass lesion/mass effect. Vascular: No hyperdense vessel or unexpected calcification. Skull: Normal. Negative for fracture or focal lesion. Sinuses/Orbits: No acute finding. Other: None. IMPRESSION: No acute intracranial findings. Electronically Signed   By: Duanne Guess D.O.   On: 08/27/2021 13:34    Procedures Procedures   Medications Ordered in ED Medications  metoCLOPramide (REGLAN) tablet 10 mg (10 mg Oral Given 08/27/21  1129)  ketorolac (TORADOL) injection 60 mg (60 mg Intramuscular Given 08/27/21 1215)  dexamethasone (DECADRON) injection 10 mg (10 mg Intramuscular Given 08/27/21 1215)  oxyCODONE-acetaminophen (PERCOCET/ROXICET) 5-325 MG per tablet 2 tablet (2 tablets Oral Given 08/27/21 1356)    ED Course  I have reviewed the triage vital signs and the nursing notes.  Pertinent labs & imaging results that were available during my care of the patient were reviewed by me and considered in my medical decision making (see chart for details).  Clinical Course as of 08/27/21 1511  Sun Aug 27, 2021  1205 Patient reevaluated and noted to still have a headache.  Case discussed with attending who agrees with  Toradol and recommends Decadron.  [SB]  [id number] Re-eval and symptoms improved initially with pain regimen, however, patient with continued symptoms. [SB]  1314 Case discussed with Attending who recommends CT head and PO percocet. [SB]  1510 Patient evaluated prior to discharge, resting comfortably in stretcher, talking on phone.  Notified patient of negative CT and COVID and flu swab.  Discussed with patient discharge treatment plan.  Patient agreeable at this time.  Patient appears safe for discharge. [SB]    Clinical Course User Index [SB] Theodosia Bahena A, PA-C   MDM Rules/Calculators/A&P                         Pt presented to the ED with gradual, sharp/shooting, left-sided headache onset x3 days.  Patient has history of headaches, these are similar.  No vision changes, photophobia, phonophobia. Differential diagnosis includes SAH, ICH, migraine, tension headache.  Vital signs stable. No focal neurodeficits on exam.  No vision changes.  No red flags of neck pain, neck stiffness, focal neuro deficits, or worst headache of life.  Patient given Reglan, Toradol, Decadron, and Percocet in the ED with relief of their symptoms.  CT head without acute intracranial abnormality.  Presentation less likely due to [facility] or ICH  due to absence of red flags.  Patient tolerated p.o. challenge in the ED.  Patient presentation suspicious for migraine headache.  Will send prescription for ibuprofen to take as directed.  Discussed with patient they may follow-up with their primary care provider as needed.  Supportive care measures and strict return precautions discussed with patient.  Patient knowledges and verbalized understanding.  Patient agreeable to discharge treatment plan. Patient appears safe for discharge at this time.  Follow-up as indicated in the discharge paperwork.   Final Clinical Impression(s) / ED Diagnoses Final diagnoses:  Acute nonintractable headache, unspecified headache type    Rx / DC Orders ED Discharge Orders          Ordered    ibuprofen (ADVIL) 600 MG tablet  Every 6 hours PRN        08/27/21 1508             Wisdom Seybold A, PA-C 08/27/21 1511    Lacretia Leigh, MD 08/28/21 [phone]

## 2021-08-27 NOTE — Discharge Instructions (Addendum)
Your CT head was negative today.  You will be prescribed ibuprofen take as prescribed.  You may follow-up with your primary care provider as needed.  Return to the ED if you are experiencing increasing/worsening headache, vision changes, or worsening symptoms.

## 2021-08-27 NOTE — ED Triage Notes (Signed)
Reports a L sided throbbing pain q 60 seconds - 2 min over the past few days, denies soreness, reports area is tender to palpation. Denies ear, dental or eye issues.

## 2021-08-27 NOTE — ED Provider Notes (Signed)
Emergency Medicine Provider Triage Evaluation Note  Sheryl Garcia , a 40 y.o. female  was evaluated in triage.  Pt complains of left-sided headache x3 days.  She has intermittent sharp, shooting sensation to the left temporal region that occurs every 60 seconds.  Patient has sick contacts of her nephew who had a viral illness 1 week ago.  She has associated rhinorrhea, nasal congestion.  Patient has a history of headaches.  She tried ibuprofen with no relief of her symptoms.  Denies vision changes, vision loss, ear pain, sore throat, fever, chills, abdominal pain.   Review of Systems  Positive: Headache Negative: Vision changes, fever  Physical Exam  BP (!) 155/88 (BP Location: Right Arm)    Pulse 80    Temp 98.3 F (36.8 C) (Oral)    Resp 16    Ht 5\' 3"  (1.6 m)    Wt 127 kg    SpO2 97%    BMI 49.60 kg/m  Gen:   Awake, no distress   Resp:  Normal effort  MSK:   Moves extremities without difficulty  Other:  Tenderness to palpation to left temporal region.  No overlying skin changes.  EOMI.  PERRL.  No frontal or maxillary sinus tenderness to palpation.  Medical Decision Making  Medically screening exam initiated at 10:43 AM.  Appropriate orders placed.  Sheryl Garcia was informed that the remainder of the evaluation will be completed by another provider, this initial triage assessment does not replace that evaluation, and the importance of remaining in the ED until their evaluation is complete.   Janvi Ammar A, PA-C 08/27/21 1054    08/29/21, MD 08/28/21 [phone]

## 2021-08-27 NOTE — ED Notes (Signed)
Patient transported to CT 

## 2021-08-29 ENCOUNTER — Other Ambulatory Visit: Payer: Self-pay | Admitting: Nurse Practitioner

## 2021-09-02 ENCOUNTER — Encounter (HOSPITAL_COMMUNITY): Payer: Self-pay | Admitting: Emergency Medicine

## 2021-09-02 ENCOUNTER — Emergency Department (HOSPITAL_COMMUNITY)
Admission: EM | Admit: 2021-09-02 | Discharge: 2021-09-02 | Disposition: A | Discharge status: Home / Self Care | Payer: No Typology Code available for payment source | Attending: Emergency Medicine | Admitting: Emergency Medicine

## 2021-09-02 ENCOUNTER — Emergency Department (HOSPITAL_COMMUNITY): Payer: No Typology Code available for payment source

## 2021-09-02 ENCOUNTER — Other Ambulatory Visit: Payer: Self-pay

## 2021-09-02 DIAGNOSIS — J101 Influenza due to other identified influenza virus with other respiratory manifestations: Secondary | ICD-10-CM | POA: Diagnosis not present

## 2021-09-02 DIAGNOSIS — I10 Essential (primary) hypertension: Secondary | ICD-10-CM | POA: Insufficient documentation

## 2021-09-02 DIAGNOSIS — J029 Acute pharyngitis, unspecified: Secondary | ICD-10-CM | POA: Diagnosis present

## 2021-09-02 DIAGNOSIS — Z79899 Other long term (current) drug therapy: Secondary | ICD-10-CM | POA: Insufficient documentation

## 2021-09-02 DIAGNOSIS — Z20822 Contact with and (suspected) exposure to covid-19: Secondary | ICD-10-CM | POA: Insufficient documentation

## 2021-09-02 LAB — BASIC METABOLIC PANEL
Anion gap: 6 (ref 5–15)
BUN: 10 mg/dL (ref 6–20)
CO2: 22 mmol/L (ref 22–32)
Calcium: 9.5 mg/dL (ref 8.9–10.3)
Chloride: 107 mmol/L (ref 98–111)
Creatinine, Ser: 0.69 mg/dL (ref 0.44–1.00)
GFR, Estimated: >60 mL/min (ref >60)
Glucose, Bld: 92 mg/dL (ref 70–99)
Potassium: 3.9 mmol/L (ref 3.5–5.1)
Sodium: 135 mmol/L (ref 135–145)

## 2021-09-02 LAB — CBC WITH DIFFERENTIAL/PLATELET
Abs Immature Granulocytes: 0.02 10*3/uL (ref 0.00–0.07)
Basophils Absolute: 0 10*3/uL (ref 0.0–0.1)
Basophils Relative: 0 %
Eosinophils Absolute: 0.1 10*3/uL (ref 0.0–0.5)
Eosinophils Relative: 1 %
HCT: 38.3 % (ref 36.0–46.0)
Hemoglobin: 12 g/dL (ref 12.0–15.0)
Immature Granulocytes: 0 %
Lymphocytes Relative: 32 %
Lymphs Abs: 2.2 10*3/uL (ref 0.7–4.0)
MCH: 25.9 pg — ABNORMAL LOW (ref 26.0–34.0)
MCHC: 31.3 g/dL (ref 30.0–36.0)
MCV: 82.5 fL (ref 80.0–100.0)
Monocytes Absolute: 0.7 10*3/uL (ref 0.1–1.0)
Monocytes Relative: 10 %
Neutro Abs: 3.9 10*3/uL (ref 1.7–7.7)
Neutrophils Relative %: 57 %
Platelets: 377 10*3/uL (ref 150–400)
RBC: 4.64 MIL/uL (ref 3.87–5.11)
RDW: 16.8 % — ABNORMAL HIGH (ref 11.5–15.5)
WBC: 6.9 10*3/uL (ref 4.0–10.5)
nRBC: 0 % (ref 0.0–0.2)

## 2021-09-02 LAB — RESP PANEL BY RT-PCR (FLU A&B, COVID) ARPGX2
Influenza A by PCR: POSITIVE — AB
Influenza B by PCR: NEGATIVE
SARS Coronavirus 2 by RT PCR: NEGATIVE

## 2021-09-02 LAB — GROUP A STREP BY PCR: Group A Strep by PCR: NOT DETECTED

## 2021-09-02 MED ORDER — BENZONATATE 100 MG PO CAPS: 100.0000 mg | ORAL_CAPSULE | Freq: Three times a day (TID) | ORAL | 0 refills | Status: DC

## 2021-09-02 NOTE — Discharge Instructions (Signed)
Your work-up today was reassuring.  No evidence of pneumonia, you do have flu.  Take Tylenol Motrin as needed for body aches.  He can take the [facility] every 8 hours as needed for cough.  Up with your primary care doctor as needed in a week with resolving symptoms.

## 2021-09-02 NOTE — ED Provider Notes (Signed)
Emergency Medicine Provider Triage Evaluation Note  Sheryl Garcia , a 39 y.o. female  was evaluated in triage.  Pt complains of cough.  Review of Systems  Positive: Cough, sob, L sided cp, fever, chills, body aches, congestion Negative: N/v/d  Physical Exam  BP (!) 138/100 (BP Location: Left Arm)    Pulse (!) 117    Temp 98.2 F (36.8 C) (Oral)    Resp 18    LMP 08/14/2021    SpO2 99%  Gen:   Awake, no distress   Resp:  Normal effort  MSK:   Moves extremities without difficulty  Other:    Medical Decision Making  Medically screening exam initiated at 11:15 AM.  Appropriate orders placed.  Sheryl Garcia was informed that the remainder of the evaluation will be completed by another provider, this initial triage assessment does not replace that evaluation, and the importance of remaining in the ED until their evaluation is complete.  Pt with flu like sxs x 1 week.  Had several neg covid tests at home.  Now having pain to L side of chest with cough.  Concerns for pna.    Fayrene Helper, PA-C 09/02/21 1116    Lorre Nick, MD 09/06/21 1309

## 2021-09-02 NOTE — ED Triage Notes (Signed)
Pt reports nasal congestion, sore throat, cough, rib cage pain on left side, and HTN due to not having BP meds over the last 2 days.

## 2021-09-02 NOTE — ED Provider Notes (Signed)
[facility] DEPT Provider Note   CSN: [id number] Arrival date & time: 09/02/21  1103     History Chief Complaint  Patient presents with   Sore Throat   Nasal Congestion    Sheryl Garcia is a 40 y.o. female.  HPI  Patient presents with nasal congestion, sore throat, myalgias, cough x1 week.  Reports she ran of her blood pressure medicine 3 days ago, denies any chest pain other than when she coughs.  The cough is intermittent, occasionally is productive.  She has had fevers at home, denies feeling significantly short of breath.  Does feel short of breath during a coughing fit.  She has tried over-the-counter Alka-Seltzer, DayQuil, NyQuil, TheraFlu without any improvement in her symptoms.  She has not been around any sick contacts to her knowledge.  Not on any oral birth control, no history of PE or ACS, no recent travel or surgeries.  Past Medical History:  Diagnosis Date   GERD (gastroesophageal reflux disease)     Patient Active Problem List   Diagnosis Date Noted   Essential hypertension 04/06/2020   OBESITY, NOS 10/31/2006   GASTROESOPHAGEAL REFLUX, NO ESOPHAGITIS 10/31/2006    History reviewed. No pertinent surgical history.   OB History   No obstetric history on file.     History reviewed. No pertinent family history.  Social History   Tobacco Use   Smoking status: Never   Smokeless tobacco: Never  Substance Use Topics   Alcohol use: No   Drug use: No    Home Medications Prior to Admission medications   Medication Sig Start Date End Date Taking? Authorizing Provider  benzonatate (TESSALON) 100 MG capsule Take 1 capsule (100 mg total) by mouth every 8 (eight) hours. 09/02/21  Yes Sherrill Raring, PA-C  hydrochlorothiazide (HYDRODIURIL) 12.5 MG tablet TAKE 1 TABLET BY MOUTH EVERY DAY 05/24/21   Minette Brine, FNP  ibuprofen (ADVIL) 600 MG tablet Take 1 tablet (600 mg total) by mouth every 6 (six) hours as needed. 08/27/21   Blue,  Soijett A, PA-C  Magnesium 200 MG TABS Take daily with evening meal Patient taking differently: Take daily with evening meal. Pt taking 500mg  daily 02/04/20   Minette Brine, FNP  metFORMIN (GLUCOPHAGE) 500 MG tablet TAKE 1 TABLET BY MOUTH EVERY DAY WITH BREAKFAST 05/01/21   Minette Brine, FNP  olmesartan (BENICAR) 20 MG tablet Take 20 mg by mouth daily. 02/07/21   [provider]  tirzepatide [name]) 7.5 MG/0.5ML Pen Inject 7.5 mg into the skin once a week. 08/16/21   Bary Castilla, NP  Vitamin D, Ergocalciferol, (DRISDOL) 1.25 MG (50000 UNIT) CAPS capsule Take 1 capsule (50,000 Units total) by mouth every 7 (seven) days. 08/20/21   Bary Castilla, NP    Allergies    Patient has no known allergies.  Review of Systems   Review of Systems  Constitutional:  Positive for fever.  HENT:  Positive for congestion and sore throat.   Respiratory:  Positive for cough.   Musculoskeletal:  Positive for myalgias.   Physical Exam Updated Vital Signs BP 113/70 (BP Location: Left Arm)    Pulse 94    Temp 98.9 F (37.2 C) (Oral)    Resp 18    LMP 08/14/2021    SpO2 100%   Physical Exam Vitals and nursing note reviewed. Exam conducted with a chaperone present.  Constitutional:      Appearance: Normal appearance.  HENT:     Head: Normocephalic.  Nose: Congestion present.     Mouth/Throat:     Pharynx: Posterior oropharyngeal erythema present.  Eyes:     Extraocular Movements: Extraocular movements intact.     Pupils: Pupils are equal, round, and reactive to light.  Cardiovascular:     Rate and Rhythm: Regular rhythm. Tachycardia present.  Pulmonary:     Effort: Pulmonary effort is normal.     Breath sounds: Normal breath sounds.     Comments: Lungs CTA bilaterally. No accessory muscle use. Speaking in complete sentences.  Abdominal:     General: Abdomen is flat.     Palpations: Abdomen is soft.  Musculoskeletal:     Cervical back: Normal range of motion.  Neurological:      Mental Status: She is alert.  Psychiatric:        Mood and Affect: Mood normal.   ED Results / Procedures / Treatments   Labs (all labs ordered are listed, but only abnormal results are displayed) Labs Reviewed  RESP PANEL BY RT-PCR (FLU A&B, COVID) ARPGX2 - Abnormal; Notable for the following components:      Result Value   Influenza A by PCR POSITIVE (*)    All other components within normal limits  CBC WITH DIFFERENTIAL/PLATELET - Abnormal; Notable for the following components:   MCH 25.9 (*)    RDW 16.8 (*)    All other components within normal limits  GROUP A STREP BY PCR  BASIC METABOLIC PANEL    EKG None  Radiology DG Chest 2 View  Result Date: 09/02/2021 CLINICAL DATA:  Cough and shortness of breath. EXAM: CHEST - 2 VIEW COMPARISON:  April 10 2019 FINDINGS: The heart size and mediastinal contours are within normal limits. Both lungs are clear. The visualized skeletal structures are unremarkable. IMPRESSION: No active cardiopulmonary disease. Electronically Signed   By: Abelardo Diesel M.D.   On: 09/02/2021 12:24    Procedures Procedures   Medications Ordered in ED Medications - No data to display  ED Course  I have reviewed the triage vital signs and the nursing notes.  Pertinent labs & imaging results that were available during my care of the patient were reviewed by me and considered in my medical decision making (see chart for details).    MDM Rules/Calculators/A&P                         This is a 40 year old female with history of hypertension presenting due to cough.  Lungs are clear to auscultation, she not hypoxic.  Not tachypneic.  Satting at 100% on room air.  She is tachycardic, does not have any chest pain unless coughing.  This does not sound cardiac in nature, will get a chest x-ray to evaluate for underlying pneumonia with some basic labs.  EKG ordered due to the chest pain, additionally does not appear cardiac although there is evidence of a  tachycardia there is no signs of A. fib, a flutter, diffuse ST changes.  I do not think this is ACS.  Higher suspicion that this is a viral etiology, will also get viral panel we will check strep given sore throat and erythema on exam.    Patient is tachycardic so cannot entirely rule out a PE, however she does not have risk factors.  Additionally not short of breath or having pleuritic chest pain.  The chest pain only occurs when she coughs, not with inspiration or expiration.  No recent travel, not on oral birth  control, no history of the same.  No recent surgeries or travel.  Workup: EKG shows tachycardia but no arrhythmia.  No ST elevation or depression.  No global ST elevation concerning for pericarditis.   CBC is without any findings of leukocytosis, patient is not anemic.  BMP does not show any gross electrolyte derangement, no AKI.   Patient is strep negative.   Radiograph reviewed, no evidence of pneumonia.  No widened mediastinum concerning for dissection, cardiac silhouette within normal limits no findings concerning for cardiomegaly. Viral panel: Flu positive.  Suspect patient symptoms are due to flu, also explains her tachycardia.  Tachycardia improved with some rest.  Will discharge with Tessalon Perles.  Patient discharged in stable condition.     Final Clinical Impression(s) / ED Diagnoses Final diagnoses:  Influenza A    Rx / DC Orders ED Discharge Orders          Ordered    benzonatate (TESSALON) 100 MG capsule  Every 8 hours        09/02/21 1352             Theron Arista, PA-C 09/02/21 1418    Lorre Nick, MD 09/06/21 1309

## 2021-09-14 ENCOUNTER — Telehealth: Payer: Self-pay

## 2021-09-14 NOTE — Telephone Encounter (Signed)
Prior Auth for Sheryl Garcia has been submitted through covermymeds and we are just waiting on the determination from her insurance. YL,RMA

## 2021-09-22 ENCOUNTER — Other Ambulatory Visit: Payer: Self-pay

## 2021-09-22 ENCOUNTER — Encounter: Payer: Self-pay | Admitting: Nurse Practitioner

## 2021-09-22 ENCOUNTER — Ambulatory Visit: Payer: No Typology Code available for payment source | Admitting: Nurse Practitioner

## 2021-09-22 VITALS — BP 130/68 | HR 99 | Temp 98.2°F | Ht 63.0 in | Wt 278.4 lb

## 2021-09-22 DIAGNOSIS — R7309 Other abnormal glucose: Secondary | ICD-10-CM

## 2021-09-22 DIAGNOSIS — I10 Essential (primary) hypertension: Secondary | ICD-10-CM

## 2021-09-22 DIAGNOSIS — Z6841 Body Mass Index (BMI) 40.0 and over, adult: Secondary | ICD-10-CM | POA: Diagnosis not present

## 2021-09-22 NOTE — Progress Notes (Signed)
I,Tianna Badgett,acting as a Education administrator for [employer], FNP.,have documented all relevant documentation on the behalf of Minette Brine, FNP,as directed by  Minette Brine, FNP while in the presence of Minette Brine, [place].  This visit occurred during the SARS-CoV-2 public health emergency.  Safety protocols were in place, including screening questions prior to the visit, additional usage of staff PPE, and extensive cleaning of exam room while observing appropriate contact time as indicated for disinfecting solutions.  Subjective:     Patient ID: Sheryl Garcia , female    DOB: May 21, 1981 , 41 y.o.   MRN: [id number]   Chief Complaint  Patient presents with   Weight Check    HPI  Patient presents today for HTN and weight follow up. She works at [employer]    Wt Readings from Last 3 Encounters: 09/22/21 : 278 lb 6.4 oz (126.3 kg) 08/27/21 : 279 lb 15.8 oz (127 kg) 08/16/21 : 279 lb 9.6 oz (126.8 kg)  She has been sick since Christmas and tested positive for the flu. She feels like it helps with her appetite. She does not have any cravings especially for sweets. Making sure she is getting protein in. Will have a little constipation which she will take a stool softner to help. She has not been consistent with exercise. Her weight started at 290      Past Medical History:  Diagnosis Date   GERD (gastroesophageal reflux disease)      History reviewed. No pertinent family history.   Current Outpatient Medications:    hydrochlorothiazide (HYDRODIURIL) 12.5 MG tablet, TAKE 1 TABLET BY MOUTH EVERY DAY, Disp: 90 tablet, Rfl: 1   Magnesium 200 MG TABS, Take daily with evening meal (Patient taking differently: Take daily with evening meal. Pt taking 500mg  daily), Disp: 30 tablet, Rfl: 3   metFORMIN (GLUCOPHAGE) 500 MG tablet, TAKE 1 TABLET BY MOUTH EVERY DAY WITH BREAKFAST, Disp: 90 tablet, Rfl: 1   olmesartan (BENICAR) 20 MG tablet, Take 20 mg by mouth daily., Disp: , Rfl:    tirzepatide  (MOUNJARO) 7.5 MG/0.5ML Pen, Inject 7.5 mg into the skin once a week., Disp: 6 mL, Rfl: 0   Vitamin D, Ergocalciferol, (DRISDOL) 1.25 MG (50000 UNIT) CAPS capsule, Take 1 capsule (50,000 Units total) by mouth every 7 (seven) days., Disp: 12 capsule, Rfl: 0   No Known Allergies   Review of Systems  Constitutional: Negative.   Respiratory: Negative.    Cardiovascular: Negative.   Gastrointestinal: Negative.   Neurological: Negative.     Today's Vitals   09/22/21 1129  BP: 130/68  Pulse: 99  Temp: 98.2 F (36.8 C)  TempSrc: Oral  Weight: 278 lb 6.4 oz (126.3 kg)  Height: 5\' 3"  (1.6 m)   Body mass index is 49.32 kg/m.  Wt Readings from Last 3 Encounters:  09/22/21 278 lb 6.4 oz (126.3 kg)  08/27/21 279 lb 15.8 oz (127 kg)  08/16/21 279 lb 9.6 oz (126.8 kg)    Objective:  Physical Exam Vitals reviewed.  Constitutional:      General: She is not in acute distress.    Appearance: Normal appearance. She is obese.  Cardiovascular:     Rate and Rhythm: Normal rate and regular rhythm.     Pulses: Normal pulses.     Heart sounds: Normal heart sounds. No murmur heard. Pulmonary:     Effort: Pulmonary effort is normal. No respiratory distress.     Breath sounds: Normal breath sounds. No wheezing.  Musculoskeletal:  Cervical back: Normal range of motion and neck supple.  Skin:    General: Skin is warm and dry.     Capillary Refill: Capillary refill takes less than 2 seconds.  Neurological:     General: No focal deficit present.     Mental Status: She is alert and oriented to person, place, and time.     Cranial Nerves: No cranial nerve deficit.     Motor: No weakness.  Psychiatric:        Mood and Affect: Mood normal.        Behavior: Behavior normal.        Thought Content: Thought content normal.        Judgment: Judgment normal.        Assessment And Plan:     1. Essential hypertension Comments: Blood pressure is controlled, continue current medications  2.  Abnormal glucose Comments: HgbA1c is slightly elevated, will recheck at next visit. Continue healthy diet. Continue Monjouro, tolerating well.   3. Class 3 severe obesity due to excess calories without serious comorbidity with body mass index (BMI) of 45.0 to 49.9 in adult [facility])  She is encouraged to strive for BMI less than 30 to decrease cardiac risk. Advised to aim for at least 150 minutes of exercise per week. Her weight is stable.     Patient was given opportunity to ask questions. Patient verbalized understanding of the plan and was able to repeat key elements of the plan. All questions were answered to their satisfaction.  Minette Brine, FNP   I, Minette Brine, FNP, have reviewed all documentation for this visit. The documentation on 09/22/21 for the exam, diagnosis, procedures, and orders are all accurate and complete.   IF YOU HAVE BEEN REFERRED TO A SPECIALIST, IT MAY TAKE 1-2 WEEKS TO SCHEDULE/PROCESS THE REFERRAL. IF YOU HAVE NOT HEARD FROM US/SPECIALIST IN TWO WEEKS, PLEASE GIVE [place] A CALL AT [phone] X [phone].   THE PATIENT IS ENCOURAGED TO PRACTICE SOCIAL DISTANCING DUE TO THE COVID-19 PANDEMIC.

## 2021-09-22 NOTE — Patient Instructions (Signed)
Obesity, Adult °Obesity is having too much body fat. Being obese means that your weight is more than what is healthy for you.  °BMI (body mass index) is a number that explains how much body fat you have. If you have a BMI of 30 or more, you are obese. °Obesity can cause serious health problems, such as: °Stroke. °Coronary artery disease (CAD). °Type 2 diabetes. °Some types of cancer. °High blood pressure (hypertension). °High cholesterol. °Gallbladder stones. °Obesity can also contribute to: °Osteoarthritis. °Sleep apnea. °Infertility problems. °What are the causes? °Eating meals each day that are high in calories, sugar, and fat. °Drinking a lot of drinks that have sugar in them. °Being born with genes that may make you more likely to become obese. °Having a medical condition that causes obesity. °Taking certain medicines. °Sitting a lot (having a sedentary lifestyle). °Not getting enough sleep. °What increases the risk? °Having a family history of obesity. °Living in an area with limited access to: °Parks, recreation centers, or sidewalks. °Healthy food choices, such as grocery stores and farmers' markets. °What are the signs or symptoms? °The main sign is having too much body fat. °How is this treated? °Treatment for this condition often includes changing your lifestyle. Treatment may include: °Changing your diet. This may include making a healthy meal plan. °Exercise. This may include activity that causes your heart to beat faster (aerobic exercise) and strength training. Work with your doctor to design a program that works for you. °Medicine to help you lose weight. This may be used if you are not able to lose one pound a week after 6 weeks of healthy eating and more exercise. °Treating conditions that cause the obesity. °Surgery. Options may include gastric banding and gastric bypass. This may be done if: °Other treatments have not helped to improve your condition. °You have a BMI of 40 or higher. °You have  life-threatening health problems related to obesity. °Follow these instructions at home: °Eating and drinking ° °Follow advice from your doctor about what to eat and drink. Your doctor may tell you to: °Limit fast food, sweets, and processed snack foods. °Choose low-fat options. For example, choose low-fat milk instead of whole milk. °Eat five or more servings of fruits or vegetables each day. °Eat at home more often. This gives you more control over what you eat. °Choose healthy foods when you eat out. °Learn to read food labels. This will help you learn how much food is in one serving. °Keep low-fat snacks available. °Avoid drinks that have a lot of sugar in them. These include soda, fruit juice, iced tea with sugar, and flavored milk. °Drink enough water to keep your pee (urine) pale yellow. °Do not go on fad diets. °Physical activity °Exercise often, as told by your doctor. Most adults should get up to 150 minutes of moderate-intensity exercise every week.Ask your doctor: °What types of exercise are safe for you. °How often you should exercise. °Warm up and stretch before being active. °Do slow stretching after being active (cool down). °Rest between times of being active. °Lifestyle °Work with your doctor and a food expert (dietitian) to set a weight-loss goal that is best for you. °Limit your screen time. °Find ways to reward yourself that do not involve food. °Do not drink alcohol if: °Your doctor tells you not to drink. °You are pregnant, may be pregnant, or are planning to become pregnant. °If you drink alcohol: °Limit how much you have to: °0-1 drink a day for women. °0-2 drinks   a day for men. °Know how much alcohol is in your drink. In the [place] one drink equals one 12 oz bottle of beer (355 mL), one 5 oz glass of wine (148 mL), or one 1½ oz glass of hard liquor (44 mL). °General instructions °Keep a weight-loss journal. This can help you keep track of: °The food that you eat. °How much exercise you  get. °Take over-the-counter and prescription medicines only as told by your doctor. °Take vitamins and supplements only as told by your doctor. °Think about joining a support group. °Pay attention to your mental health as obesity can lead to depression or self esteem issues. °Keep all follow-up visits. °Contact a doctor if: °You cannot meet your weight-loss goal after you have changed your diet and lifestyle for 6 weeks. °You are having trouble breathing. °Summary °Obesity is having too much body fat. °Being obese means that your weight is more than what is healthy for you. °Work with your doctor to set a weight-loss goal. °Get regular exercise as told by your doctor. °This information is not intended to replace advice given to you by your health care provider. Make sure you discuss any questions you have with your health care provider. °Document Revised: 03/28/2021 Document Reviewed: 03/28/2021 °Elsevier Patient Education © 2022 [employer]. ° °

## 2021-09-25 ENCOUNTER — Ambulatory Visit: Payer: No Typology Code available for payment source | Admitting: Nurse Practitioner

## 2021-10-29 ENCOUNTER — Other Ambulatory Visit: Payer: Self-pay | Admitting: Nurse Practitioner

## 2021-10-29 DIAGNOSIS — I10 Essential (primary) hypertension: Secondary | ICD-10-CM

## 2021-10-30 ENCOUNTER — Telehealth: Payer: Self-pay

## 2021-10-30 NOTE — Telephone Encounter (Signed)
error 

## 2021-12-04 ENCOUNTER — Other Ambulatory Visit: Payer: Self-pay | Admitting: Nurse Practitioner

## 2021-12-04 ENCOUNTER — Encounter: Payer: Self-pay | Admitting: Nurse Practitioner

## 2021-12-07 ENCOUNTER — Ambulatory Visit: Payer: No Typology Code available for payment source | Admitting: Nurse Practitioner

## 2021-12-13 ENCOUNTER — Ambulatory Visit: Payer: No Typology Code available for payment source | Admitting: Nurse Practitioner

## 2021-12-13 ENCOUNTER — Other Ambulatory Visit: Payer: Self-pay

## 2021-12-13 ENCOUNTER — Encounter: Payer: Self-pay | Admitting: Nurse Practitioner

## 2021-12-13 VITALS — BP 116/70 | HR 70 | Temp 98.5°F | Ht 63.0 in | Wt 268.0 lb

## 2021-12-13 DIAGNOSIS — E8881 Metabolic syndrome: Secondary | ICD-10-CM

## 2021-12-13 DIAGNOSIS — R7309 Other abnormal glucose: Secondary | ICD-10-CM

## 2021-12-13 DIAGNOSIS — E7800 Pure hypercholesterolemia, unspecified: Secondary | ICD-10-CM | POA: Diagnosis not present

## 2021-12-13 DIAGNOSIS — E559 Vitamin D deficiency, unspecified: Secondary | ICD-10-CM | POA: Diagnosis not present

## 2021-12-13 DIAGNOSIS — Z6841 Body Mass Index (BMI) 40.0 and over, adult: Secondary | ICD-10-CM

## 2021-12-13 DIAGNOSIS — I10 Essential (primary) hypertension: Secondary | ICD-10-CM | POA: Diagnosis not present

## 2021-12-13 MED ORDER — VITAMIN D (ERGOCALCIFEROL) 1.25 MG (50000 UNIT) PO CAPS: 50000.0000 [IU] | ORAL_CAPSULE | ORAL | 3 refills | Status: DC

## 2021-12-13 MED ORDER — MOUNJARO 7.5 MG/0.5ML ~~LOC~~ SOAJ: 7.5000 mg | SUBCUTANEOUS | 0 refills | Status: DC

## 2021-12-13 NOTE — Patient Instructions (Signed)
Obesity, Adult °Obesity is having too much body fat. Being obese means that your weight is more than what is healthy for you.  °BMI (body mass index) is a number that explains how much body fat you have. If you have a BMI of 30 or more, you are obese. °Obesity can cause serious health problems, such as: °Stroke. °Coronary artery disease (CAD). °Type 2 diabetes. °Some types of cancer. °High blood pressure (hypertension). °High cholesterol. °Gallbladder stones. °Obesity can also contribute to: °Osteoarthritis. °Sleep apnea. °Infertility problems. °What are the causes? °Eating meals each day that are high in calories, sugar, and fat. °Drinking a lot of drinks that have sugar in them. °Being born with genes that may make you more likely to become obese. °Having a medical condition that causes obesity. °Taking certain medicines. °Sitting a lot (having a sedentary lifestyle). °Not getting enough sleep. °What increases the risk? °Having a family history of obesity. °Living in an area with limited access to: °Parks, recreation centers, or sidewalks. °Healthy food choices, such as grocery stores and farmers' markets. °What are the signs or symptoms? °The main sign is having too much body fat. °How is this treated? °Treatment for this condition often includes changing your lifestyle. Treatment may include: °Changing your diet. This may include making a healthy meal plan. °Exercise. This may include activity that causes your heart to beat faster (aerobic exercise) and strength training. Work with your doctor to design a program that works for you. °Medicine to help you lose weight. This may be used if you are not able to lose one pound a week after 6 weeks of healthy eating and more exercise. °Treating conditions that cause the obesity. °Surgery. Options may include gastric banding and gastric bypass. This may be done if: °Other treatments have not helped to improve your condition. °You have a BMI of 40 or higher. °You have  life-threatening health problems related to obesity. °Follow these instructions at home: °Eating and drinking ° °Follow advice from your doctor about what to eat and drink. Your doctor may tell you to: °Limit fast food, sweets, and processed snack foods. °Choose low-fat options. For example, choose low-fat milk instead of whole milk. °Eat five or more servings of fruits or vegetables each day. °Eat at home more often. This gives you more control over what you eat. °Choose healthy foods when you eat out. °Learn to read food labels. This will help you learn how much food is in one serving. °Keep low-fat snacks available. °Avoid drinks that have a lot of sugar in them. These include soda, fruit juice, iced tea with sugar, and flavored milk. °Drink enough water to keep your pee (urine) pale yellow. °Do not go on fad diets. °Physical activity °Exercise often, as told by your doctor. Most adults should get up to 150 minutes of moderate-intensity exercise every week.Ask your doctor: °What types of exercise are safe for you. °How often you should exercise. °Warm up and stretch before being active. °Do slow stretching after being active (cool down). °Rest between times of being active. °Lifestyle °Work with your doctor and a food expert (dietitian) to set a weight-loss goal that is best for you. °Limit your screen time. °Find ways to reward yourself that do not involve food. °Do not drink alcohol if: °Your doctor tells you not to drink. °You are pregnant, may be pregnant, or are planning to become pregnant. °If you drink alcohol: °Limit how much you have to: °0-1 drink a day for women. °0-2 drinks   a day for men. °Know how much alcohol is in your drink. In the [place] one drink equals one 12 oz bottle of beer (355 mL), one 5 oz glass of wine (148 mL), or one 1½ oz glass of hard liquor (44 mL). °General instructions °Keep a weight-loss journal. This can help you keep track of: °The food that you eat. °How much exercise you  get. °Take over-the-counter and prescription medicines only as told by your doctor. °Take vitamins and supplements only as told by your doctor. °Think about joining a support group. °Pay attention to your mental health as obesity can lead to depression or self esteem issues. °Keep all follow-up visits. °Contact a doctor if: °You cannot meet your weight-loss goal after you have changed your diet and lifestyle for 6 weeks. °You are having trouble breathing. °Summary °Obesity is having too much body fat. °Being obese means that your weight is more than what is healthy for you. °Work with your doctor to set a weight-loss goal. °Get regular exercise as told by your doctor. °This information is not intended to replace advice given to you by your health care provider. Make sure you discuss any questions you have with your health care provider. °Document Revised: 03/28/2021 Document Reviewed: 03/28/2021 °Elsevier Patient Education © 2022 [employer]. ° °

## 2021-12-13 NOTE — Progress Notes (Signed)
?Industrial/product designer as a Education administrator for [employer], FNP.,have documented all relevant documentation on the behalf of Minette Brine, FNP,as directed by  Minette Brine, FNP while in the presence of Minette Brine, [place]. ? ?This visit occurred during the SARS-CoV-2 public health emergency.  Safety protocols were in place, including screening questions prior to the visit, additional usage of staff PPE, and extensive cleaning of exam room while observing appropriate contact time as indicated for disinfecting solutions. ? ?Subjective:  ?  ? Patient ID: Sheryl Garcia , female    DOB: Sep 14, 1980 , 41 y.o.   MRN: [id number] ? ? ?Chief Complaint  ?Patient presents with  ? Weight Check  ? ? ?HPI ? ?She is here for follow up for insulin resistance. She is taking mounjaro 5 mg weekly. Denies nausea and constipation. She is exercising 4-5 days a week. Diet - she is doing intermittent fasthing 12-8, limiting her carbs and sugars with one cheat meal once a week. Vegetables and proteins with her 1 gallon of water.  ? ?Wt Readings from Last 3 Encounters: ?12/13/21 : 268 lb (121.6 kg) ?09/22/21 : 278 lb 6.4 oz (126.3 kg) ?08/27/21 : 279 lb 15.8 oz (127 kg) ? ? ?  ? ?Past Medical History:  ?Diagnosis Date  ? GERD (gastroesophageal reflux disease)   ?  ? ?History reviewed. No pertinent family history. ? ? ?Current Outpatient Medications:  ?  hydrochlorothiazide (HYDRODIURIL) 12.5 MG tablet, TAKE 1 TABLET BY MOUTH EVERY DAY, Disp: 90 tablet, Rfl: 0 ?  Magnesium 200 MG TABS, Take daily with evening meal (Patient taking differently: Take daily with evening meal. Pt taking $RemoveB'500mg'sGPqtTlo$  daily), Disp: 30 tablet, Rfl: 3 ?  metFORMIN (GLUCOPHAGE) 500 MG tablet, TAKE 1 TABLET BY MOUTH EVERY DAY WITH BREAKFAST, Disp: 90 tablet, Rfl: 1 ?  olmesartan (BENICAR) 20 MG tablet, Take 20 mg by mouth daily., Disp: , Rfl:  ?  tirzepatide (MOUNJARO) 7.5 MG/0.5ML Pen, Inject 7.5 mg into the skin once a week., Disp: 6 mL, Rfl: 0 ?  Vitamin D, Ergocalciferol,  (DRISDOL) 1.25 MG (50000 UNIT) CAPS capsule, Take 1 capsule (50,000 Units total) by mouth every 7 (seven) days., Disp: 12 capsule, Rfl: 3  ? ?No Known Allergies  ? ?Review of Systems  ?Constitutional: Negative.   ?Respiratory: Negative.    ?Cardiovascular: Negative.   ?Gastrointestinal: Negative.   ?Neurological: Negative.    ? ?Today's Vitals  ? 12/13/21 0928  ?BP: 116/70  ?Pulse: 70  ?Temp: 98.5 ?F (36.9 ?C)  ?TempSrc: Oral  ?Weight: 268 lb (121.6 kg)  ?Height: $RemoveB'5\' 3"'JTCjkukv$  (1.6 m)  ? ?Body mass index is 47.47 kg/m?.  ?Wt Readings from Last 3 Encounters:  ?12/13/21 268 lb (121.6 kg)  ?09/22/21 278 lb 6.4 oz (126.3 kg)  ?08/27/21 279 lb 15.8 oz (127 kg)  ? ? ?Objective:  ?Physical Exam ?Vitals reviewed.  ?Constitutional:   ?   General: She is not in acute distress. ?   Appearance: Normal appearance. She is obese.  ?Cardiovascular:  ?   Rate and Rhythm: Normal rate and regular rhythm.  ?   Pulses: Normal pulses.  ?   Heart sounds: Normal heart sounds. No murmur heard. ?Pulmonary:  ?   Effort: Pulmonary effort is normal. No respiratory distress.  ?   Breath sounds: Normal breath sounds. No wheezing.  ?Musculoskeletal:  ?   Cervical back: Normal range of motion and neck supple.  ?Skin: ?   General: Skin is warm and dry.  ?   Capillary  Refill: Capillary refill takes less than 2 seconds.  ?Neurological:  ?   General: No focal deficit present.  ?   Mental Status: She is alert and oriented to person, place, and time.  ?   Cranial Nerves: No cranial nerve deficit.  ?   Motor: No weakness.  ?Psychiatric:     ?   Mood and Affect: Mood normal.     ?   Behavior: Behavior normal.     ?   Thought Content: Thought content normal.     ?   Judgment: Judgment normal.  ?  ? ?   ?Assessment And Plan:  ?   ?1. Insulin resistance ?Continue mounjaro, will check insulin levels.  ?- Hemoglobin A1c ? ?2. Essential hypertension ?B/P is controlled.  ?CMP ordered to check renal function.  ?The importance of regular exercise and dietary modification  was stressed to the patient.  ?- CMP14+EGFR ? ?3. Vitamin D deficiency ?Will check vitamin D level and supplement as needed.    ?Also encouraged to spend 15 minutes in the sun daily.  ?- VITAMIN D 25 Hydroxy (Vit-D Deficiency, Fractures) ? ?4. Elevated cholesterol ?Diet controlled, continue low fat diet ?- Lipid panel ? ?5. Class 3 severe obesity due to excess calories without serious comorbidity with body mass index (BMI) of 45.0 to 49.9 in adult [facility]) ?She is encouraged to strive for BMI less than 30 to decrease cardiac risk. Advised to aim for at least 150 minutes of exercise per week. ?Congratulated on her 10 lb weight loss.  ?- tirzepatide (MOUNJARO) 7.5 MG/0.5ML Pen; Inject 7.5 mg into the skin once a week.  Dispense: 6 mL; Refill: 0 ? ?6. Abnormal glucose ?Will send 7.5 mg mounjaro to see if is in stock ?- tirzepatide [facility]) 7.5 MG/0.5ML Pen; Inject 7.5 mg into the skin once a week.  Dispense: 6 mL; Refill: 0 ? ? ?Patient was given opportunity to ask questions. Patient verbalized understanding of the plan and was able to repeat key elements of the plan. All questions were answered to their satisfaction.  ?Minette Brine, FNP  ? ?I, Minette Brine, FNP, have reviewed all documentation for this visit. The documentation on 12/13/21 for the exam, diagnosis, procedures, and orders are all accurate and complete.  ? ?IF YOU HAVE BEEN REFERRED TO A SPECIALIST, IT MAY TAKE 1-2 WEEKS TO SCHEDULE/PROCESS THE REFERRAL. IF YOU HAVE NOT HEARD FROM US/SPECIALIST IN TWO WEEKS, PLEASE GIVE [place] A CALL AT [phone] X [phone].  ? ?THE PATIENT IS ENCOURAGED TO PRACTICE SOCIAL DISTANCING DUE TO THE COVID-19 PANDEMIC.   ?

## 2021-12-14 LAB — LIPID PANEL
Chol/HDL Ratio: 4.1 ratio (ref 0.0–4.4)
Cholesterol, Total: 205 mg/dL — ABNORMAL HIGH (ref 100–199)
HDL: 50 mg/dL (ref >39)
LDL Chol Calc (NIH): 143 mg/dL — ABNORMAL HIGH (ref 0–99)
Triglycerides: 69 mg/dL (ref 0–149)
VLDL Cholesterol Cal: 12 mg/dL (ref 5–40)

## 2021-12-14 LAB — CMP14+EGFR
ALT: 20 IU/L (ref 0–32)
AST: 24 IU/L (ref 0–40)
Albumin/Globulin Ratio: 1.5 (ref 1.2–2.2)
Albumin: 4.4 g/dL (ref 3.8–4.8)
Alkaline Phosphatase: 75 IU/L (ref 44–121)
BUN/Creatinine Ratio: 20 (ref 9–23)
BUN: 14 mg/dL (ref 6–24)
Bilirubin Total: 0.2 mg/dL (ref 0.0–1.2)
CO2: 20 mmol/L (ref 20–29)
Calcium: 10.8 mg/dL — ABNORMAL HIGH (ref 8.7–10.2)
Chloride: 102 mmol/L (ref 96–106)
Creatinine, Ser: 0.69 mg/dL (ref 0.57–1.00)
Globulin, Total: 3 g/dL (ref 1.5–4.5)
Glucose: 70 mg/dL (ref 70–99)
Potassium: 4.8 mmol/L (ref 3.5–5.2)
Sodium: 136 mmol/L (ref 134–144)
Total Protein: 7.4 g/dL (ref 6.0–8.5)
eGFR: 111 mL/min/{1.73_m2} (ref >59)

## 2021-12-14 LAB — VITAMIN D 25 HYDROXY (VIT D DEFICIENCY, FRACTURES): Vit D, 25-Hydroxy: 45.3 ng/mL (ref 30.0–100.0)

## 2021-12-14 LAB — HEMOGLOBIN A1C
Est. average glucose Bld gHb Est-mCnc: 108 mg/dL
Hgb A1c MFr Bld: 5.4 % (ref 4.8–5.6)

## 2021-12-14 LAB — INSULIN, RANDOM: INSULIN: 16.9 u[IU]/mL (ref 2.6–24.9)

## 2021-12-22 ENCOUNTER — Encounter: Payer: Self-pay | Admitting: Nurse Practitioner

## 2022-01-13 IMAGING — CT CT HEAD W/O CM
3 series · 16 of 47 positions shown, 19 images · non-contrast
Comparison: None.

CLINICAL DATA: Left-sided headache

EXAM:
CT HEAD WITHOUT CONTRAST
TECHNIQUE: Contiguous axial images were obtained from the base of the skull
through the vertex without intravenous contrast.

[Series 2: head wo · axial · 0.47mm/px · z∈[-138,-13]mm · 10 of 30 slices shown, 13 images]
[im 3/30  brain]
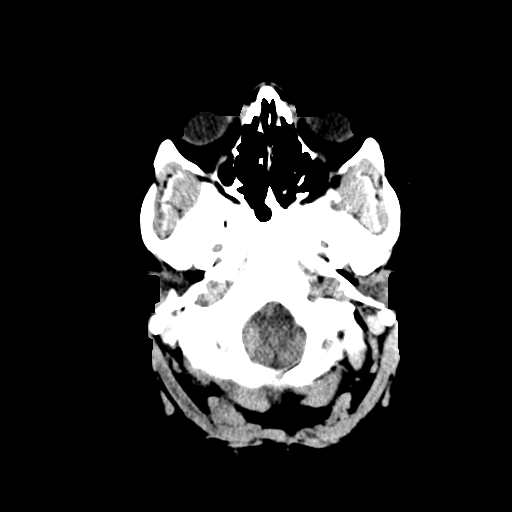
[im 3/30  bone]
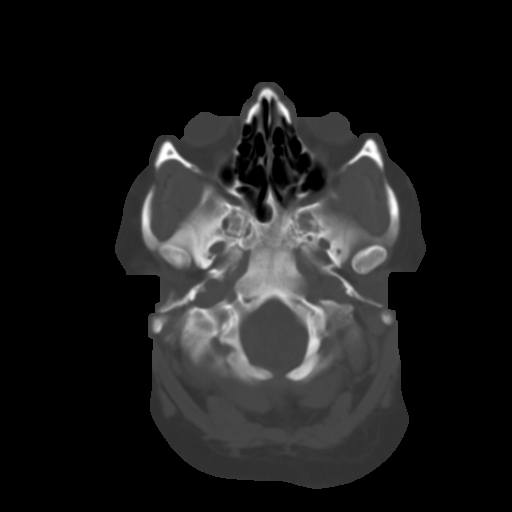
[im 6/30  brain]
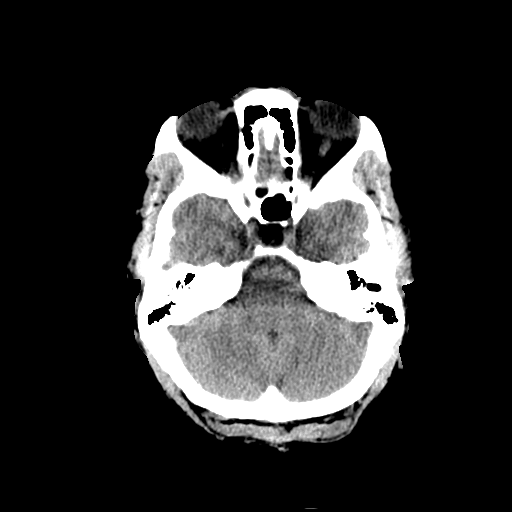
[im 9/30  brain]
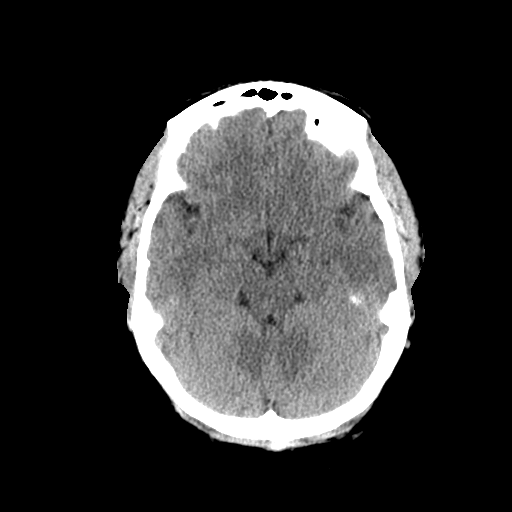
[im 11/30  brain]
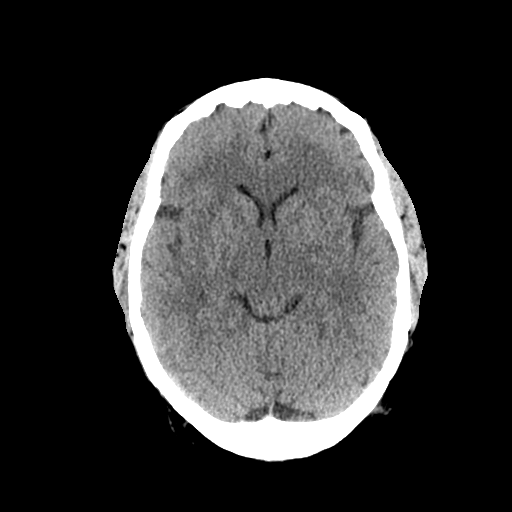
[im 14/30  brain]
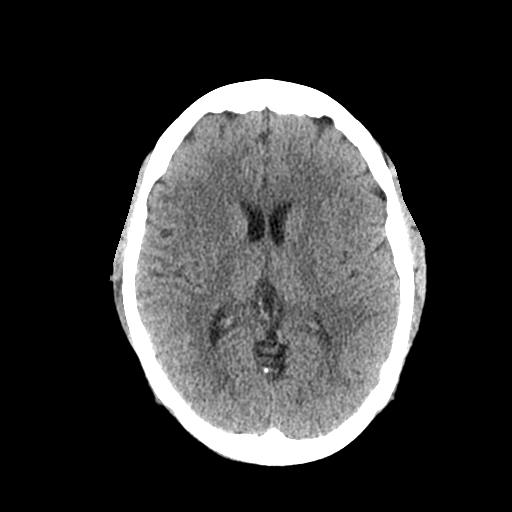
[im 14/30  bone]
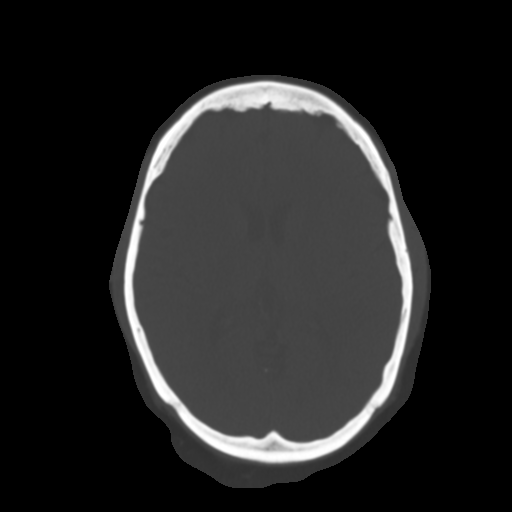
[im 17/30  brain]
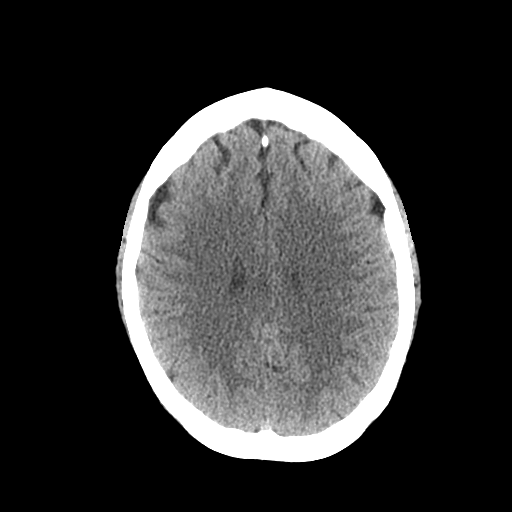
[im 20/30  brain]
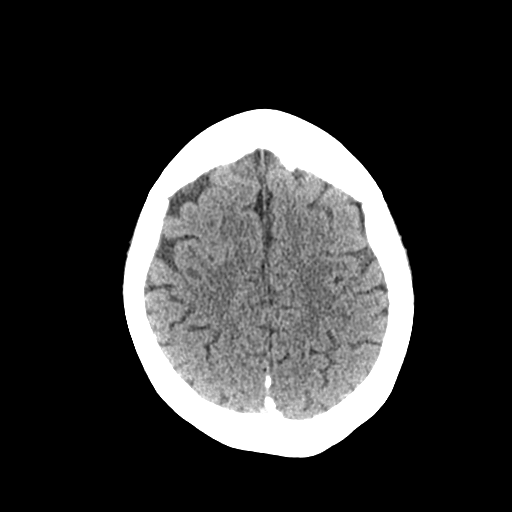
[im 23/30  brain]
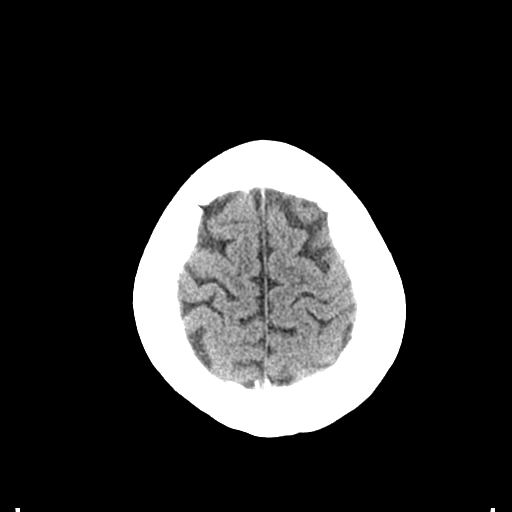
[im 25/30  brain]
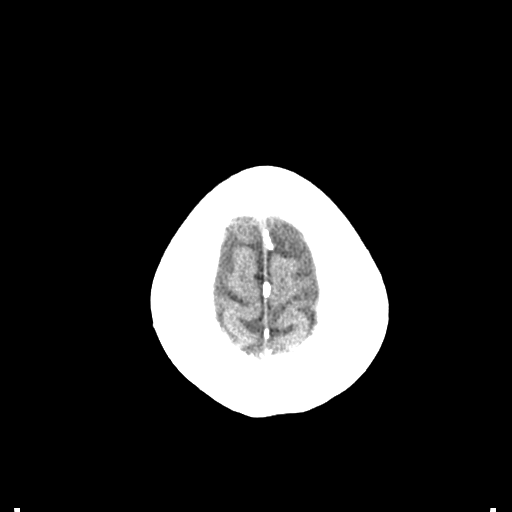
[im 25/30  bone]
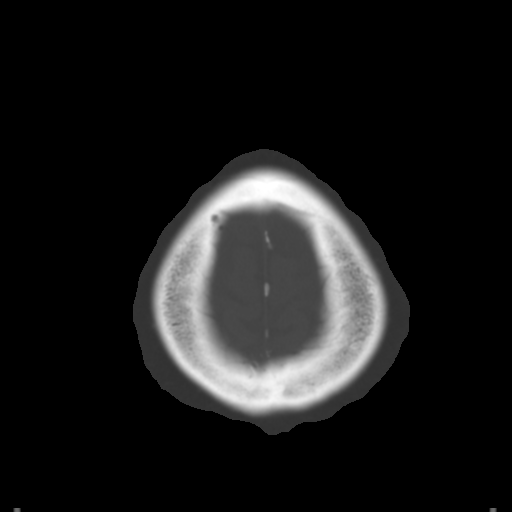
[im 28/30  brain]
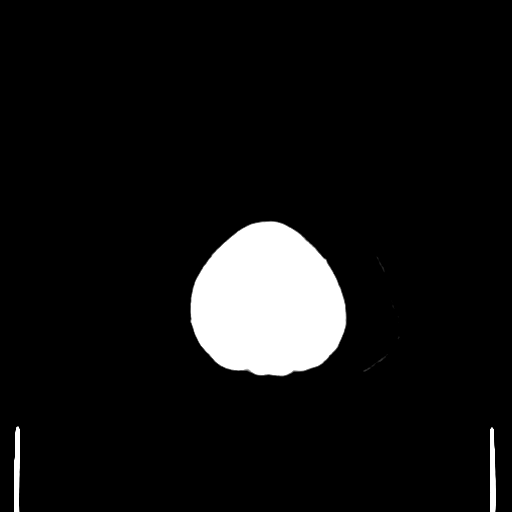

[Series 5: coronal soft tissue · coronal · 0.30mm/px · 3 of 75 slices shown]
[im 25/75  brain]
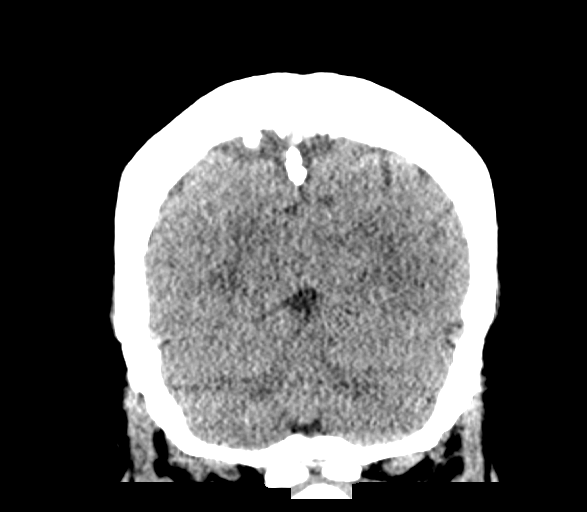
[im 33/75  brain]
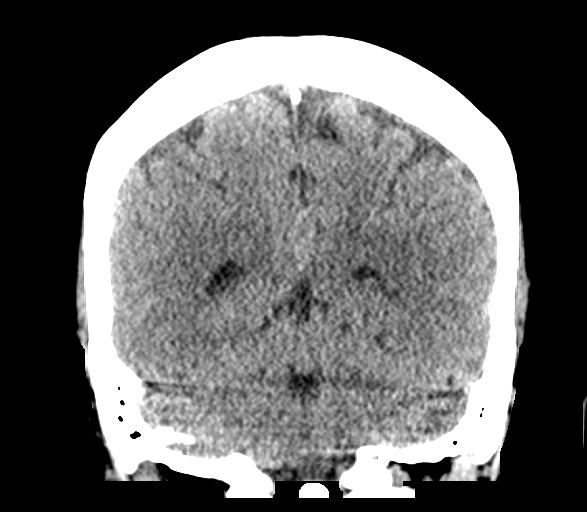
[im 42/75  brain]
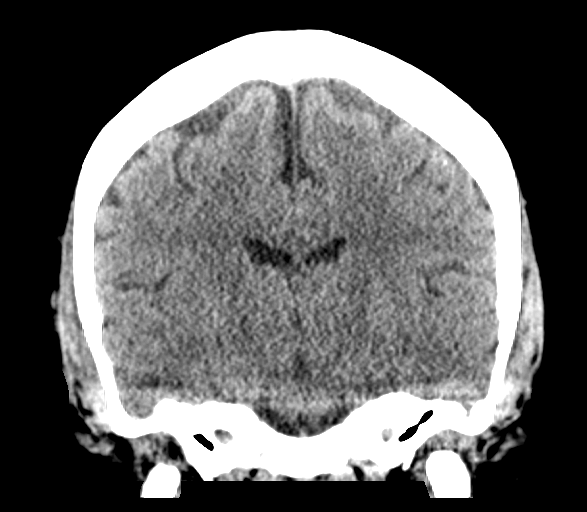

[Series 6: sagittal soft tissue · sagittal · 0.31mm/px · 3 of 55 slices shown]
[im 19/55  brain]
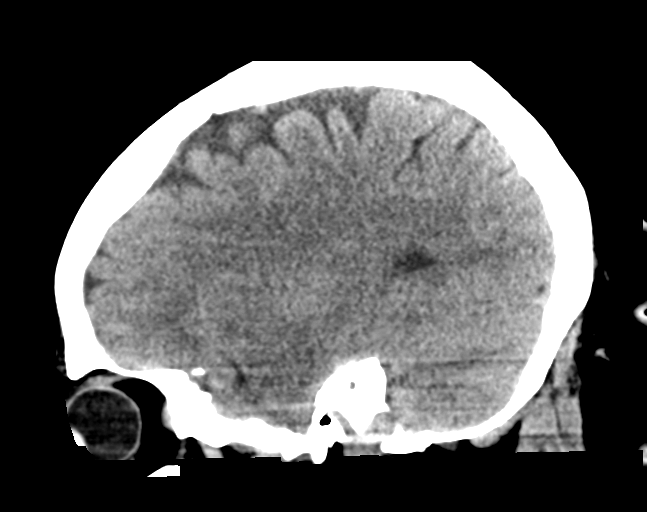
[im 28/55  brain]
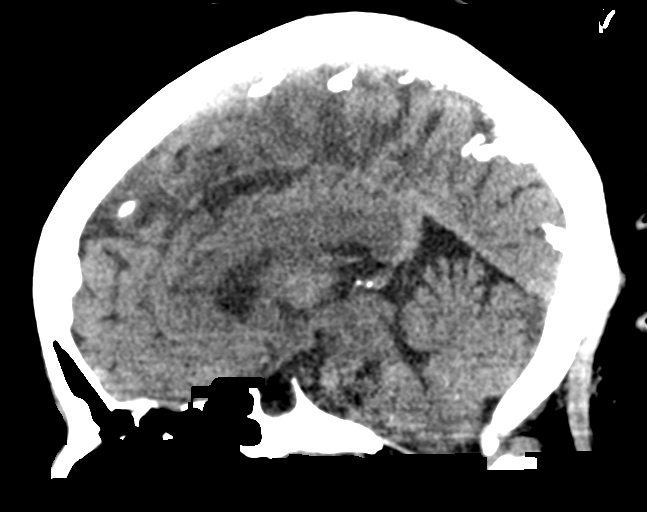
[im 37/55  brain]
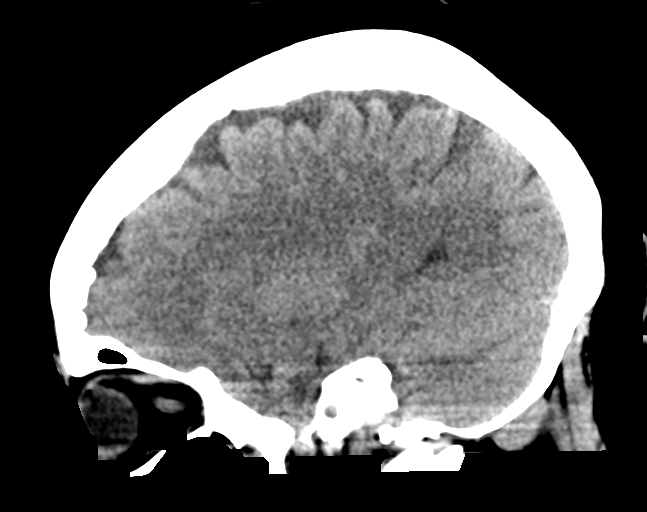

[16 of 47 positions shown; findings below may reference images not displayed]

FINDINGS: Brain: No evidence of acute infarction, hemorrhage, hydrocephalus,
extra-axial collection or mass lesion/mass effect.

Vascular: No hyperdense vessel or unexpected calcification.

Skull: Normal. Negative for fracture or focal lesion.

Sinuses/Orbits: No acute finding.

Other: None.
IMPRESSION: No acute intracranial findings.

## 2022-01-22 ENCOUNTER — Ambulatory Visit: Payer: No Typology Code available for payment source | Admitting: Podiatry

## 2022-01-22 DIAGNOSIS — M7741 Metatarsalgia, right foot: Secondary | ICD-10-CM

## 2022-01-22 DIAGNOSIS — M7742 Metatarsalgia, left foot: Secondary | ICD-10-CM

## 2022-01-22 NOTE — Progress Notes (Signed)
   HPI: 41 y.o. female presenting today as a new patient for evaluation of bilateral forefoot pain with callus formation.  Patient states that she has been exercising and increasing her activity and she has developed pain to the forefoot.  She denies a history of injury.  She also works at [employer] and is on her feet through the majority of the day.  She presents for further treatment and evaluation  Past Medical History:  Diagnosis Date   GERD (gastroesophageal reflux disease)     No past surgical history on file.  No Known Allergies   Physical Exam: General: The patient is alert and oriented x3 in no acute distress.  Dermatology: Skin is warm, dry and supple bilateral lower extremities. Negative for open lesions or macerations.  There is some hyperkeratotic callus noted to the plantar MTP joints of the bilateral forefoot  Vascular: Palpable pedal pulses bilaterally. Capillary refill within normal limits.  Negative for any significant edema or erythema  Neurological: Light touch and protective threshold grossly intact  Musculoskeletal Exam: No pedal deformities noted.  There is some pain on palpation to the bilateral forefoot at the metatarsal heads plantar  Assessment: 1.  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 Patient evaluated.  2.  Light debridement of the callus formations was performed using a 312 scalpel without incident or bleeding.  Patient did feel some relief 3.  We also discussed long-term care including arch supports to offload pressure from the forefoot and support the medial longitudinal arch of the foot. 4.  Power step insoles were dispensed.  Wear daily with good supportive sneakers 5.  Return to clinic as needed  *Works at [employer]     Felecia Shelling, DPM [facility]  Dr. Felecia Shelling, DPM    [address] [place], [place] [address]                Office [phone]  Fax [phone]

## 2022-01-26 ENCOUNTER — Ambulatory Visit
Admission: RE | Admit: 2022-01-26 | Discharge: 2022-01-26 | Disposition: A | Discharge status: Home / Self Care | Payer: No Typology Code available for payment source | Source: Ambulatory Visit | Attending: Nephrology | Admitting: Nephrology

## 2022-01-26 ENCOUNTER — Other Ambulatory Visit: Payer: Self-pay | Admitting: Nephrology

## 2022-02-26 ENCOUNTER — Ambulatory Visit: Payer: No Typology Code available for payment source | Admitting: Nurse Practitioner

## 2022-02-27 ENCOUNTER — Ambulatory Visit: Payer: No Typology Code available for payment source | Admitting: Nurse Practitioner

## 2022-03-05 ENCOUNTER — Encounter: Payer: Self-pay | Admitting: Nurse Practitioner

## 2022-03-05 ENCOUNTER — Ambulatory Visit: Payer: No Typology Code available for payment source | Admitting: Nurse Practitioner

## 2022-03-05 VITALS — BP 120/64 | HR 78 | Temp 97.8°F | Ht 66.0 in | Wt 242.2 lb

## 2022-03-05 DIAGNOSIS — E661 Drug-induced obesity: Secondary | ICD-10-CM

## 2022-03-05 DIAGNOSIS — Z6839 Body mass index (BMI) 39.0-39.9, adult: Secondary | ICD-10-CM | POA: Diagnosis not present

## 2022-03-05 DIAGNOSIS — E8881 Metabolic syndrome: Secondary | ICD-10-CM | POA: Insufficient documentation

## 2022-03-05 DIAGNOSIS — R7303 Prediabetes: Secondary | ICD-10-CM

## 2022-03-05 NOTE — Progress Notes (Signed)
Hershal Coria Martin,acting as a Neurosurgeon for Arnette Felts, FNP.,have documented all relevant documentation on the behalf of Arnette Felts, FNP,as directed by  Arnette Felts, FNP while in the presence of Arnette Felts, FNP.   Subjective:     Patient ID: Sheryl Garcia , female    DOB: 03/22/1981 , 41 y.o.   MRN: [id number]   Chief Complaint  Patient presents with   Weight Check    HPI  Patient presents today for follow up insulin resistance. She is currently taking Mounjaro. She is exercising 5 days a week and intermittent fasting. She is eating mostly protein and vegetable and healthy fats with 1 gallon of water a day.  Wt Readings from Last 3 Encounters: 03/05/22 : 242 lb 3.2 oz (109.9 kg) 12/13/21 : 268 lb (121.6 kg) 09/22/21 : 278 lb 6.4 oz (126.3 kg)        Past Medical History:  Diagnosis Date   GERD (gastroesophageal reflux disease)      History reviewed. No pertinent family history.   Current Outpatient Medications:    hydrochlorothiazide (HYDRODIURIL) 12.5 MG tablet, TAKE 1 TABLET BY MOUTH EVERY DAY, Disp: 90 tablet, Rfl: 0   Magnesium 200 MG TABS, Take daily with evening meal (Patient taking differently: Take daily with evening meal. Pt taking 500mg  daily), Disp: 30 tablet, Rfl: 3   olmesartan (BENICAR) 20 MG tablet, Take 20 mg by mouth daily., Disp: , Rfl:    tirzepatide (MOUNJARO) 7.5 MG/0.5ML Pen, Inject 7.5 mg into the skin once a week., Disp: 6 mL, Rfl: 0   No Known Allergies   Review of Systems  Constitutional: Negative.   Respiratory: Negative.    Cardiovascular: Negative.   Gastrointestinal: Negative.   Neurological: Negative.   Psychiatric/Behavioral: Negative.       Today's Vitals   03/05/22 1009  BP: 120/64  Pulse: 78  Temp: 97.8 F (36.6 C)  Weight: 242 lb 3.2 oz (109.9 kg)  Height: 5\' 6"  (1.676 m)  PainSc: 0-No pain   Body mass index is 39.09 kg/m.  Wt Readings from Last 3 Encounters:  03/05/22 242 lb 3.2 oz (109.9 kg)  12/13/21 268 lb  (121.6 kg)  09/22/21 278 lb 6.4 oz (126.3 kg)     Objective:  Physical Exam Vitals reviewed.  Constitutional:      General: She is not in acute distress.    Appearance: Normal appearance. She is obese.  Cardiovascular:     Rate and Rhythm: Normal rate and regular rhythm.     Pulses: Normal pulses.     Heart sounds: Normal heart sounds. No murmur heard. Pulmonary:     Effort: Pulmonary effort is normal. No respiratory distress.     Breath sounds: Normal breath sounds. No wheezing.  Musculoskeletal:     Cervical back: Normal range of motion and neck supple.  Skin:    General: Skin is warm and dry.     Capillary Refill: Capillary refill takes less than 2 seconds.  Neurological:     General: No focal deficit present.     Mental Status: She is alert and oriented to person, place, and time.     Cranial Nerves: No cranial nerve deficit.     Motor: No weakness.  Psychiatric:        Mood and Affect: Mood normal.        Behavior: Behavior normal.        Thought Content: Thought content normal.        Judgment: Judgment  normal.         Assessment And Plan:     1. Prediabetes Comments: Doing well, continue healthy diet and regular exercise.  2. 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     Patient was given opportunity to ask questions. Patient verbalized understanding of the plan and was able to repeat key elements of the plan. All questions were answered to their satisfaction.  Arnette Felts, FNP   I, Arnette Felts, FNP, have reviewed all documentation for this visit. The documentation on 03/05/22 for the exam, diagnosis, procedures, and orders are all accurate and complete.   IF YOU HAVE BEEN REFERRED TO A SPECIALIST, IT MAY TAKE 1-2 WEEKS TO SCHEDULE/PROCESS THE REFERRAL. IF YOU HAVE NOT HEARD FROM US/SPECIALIST IN TWO WEEKS,  PLEASE GIVE [place] A CALL AT [phone].   THE PATIENT IS ENCOURAGED TO PRACTICE SOCIAL DISTANCING DUE TO THE COVID-19 PANDEMIC.

## 2022-03-05 NOTE — Patient Instructions (Signed)
Obesity, Adult ?Obesity is having too much body fat. Being obese means that your weight is more than what is healthy for you.  ?BMI (body mass index) is a number that explains how much body fat you have. If you have a BMI of 30 or more, you are obese. ?Obesity can cause serious health problems, such as: ?Stroke. ?Coronary artery disease (CAD). ?Type 2 diabetes. ?Some types of cancer. ?High blood pressure (hypertension). ?High cholesterol. ?Gallbladder stones. ?Obesity can also contribute to: ?Osteoarthritis. ?Sleep apnea. ?Infertility problems. ?What are the causes? ?Eating meals each day that are high in calories, sugar, and fat. ?Drinking a lot of drinks that have sugar in them. ?Being born with genes that may make you more likely to become obese. ?Having a medical condition that causes obesity. ?Taking certain medicines. ?Sitting a lot (having a sedentary lifestyle). ?Not getting enough sleep. ?What increases the risk? ?Having a family history of obesity. ?Living in an area with limited access to: ?Parks, recreation centers, or sidewalks. ?Healthy food choices, such as grocery stores and farmers' markets. ?What are the signs or symptoms? ?The main sign is having too much body fat. ?How is this treated? ?Treatment for this condition often includes changing your lifestyle. Treatment may include: ?Changing your diet. This may include making a healthy meal plan. ?Exercise. This may include activity that causes your heart to beat faster (aerobic exercise) and strength training. Work with your doctor to design a program that works for you. ?Medicine to help you lose weight. This may be used if you are not able to lose one pound a week after 6 weeks of healthy eating and more exercise. ?Treating conditions that cause the obesity. ?Surgery. Options may include gastric banding and gastric bypass. This may be done if: ?Other treatments have not helped to improve your condition. ?You have a BMI of 40 or higher. ?You have  life-threatening health problems related to obesity. ?Follow these instructions at home: ?Eating and drinking ? ?Follow advice from your doctor about what to eat and drink. Your doctor may tell you to: ?Limit fast food, sweets, and processed snack foods. ?Choose low-fat options. For example, choose low-fat milk instead of whole milk. ?Eat five or more servings of fruits or vegetables each day. ?Eat at home more often. This gives you more control over what you eat. ?Choose healthy foods when you eat out. ?Learn to read food labels. This will help you learn how much food is in one serving. ?Keep low-fat snacks available. ?Avoid drinks that have a lot of sugar in them. These include soda, fruit juice, iced tea with sugar, and flavored milk. ?Drink enough water to keep your pee (urine) pale yellow. ?Do not go on fad diets. ?Physical activity ?Exercise often, as told by your doctor. Most adults should get up to 150 minutes of moderate-intensity exercise every week.Ask your doctor: ?What types of exercise are safe for you. ?How often you should exercise. ?Warm up and stretch before being active. ?Do slow stretching after being active (cool down). ?Rest between times of being active. ?Lifestyle ?Work with your doctor and a food expert (dietitian) to set a weight-loss goal that is best for you. ?Limit your screen time. ?Find ways to reward yourself that do not involve food. ?Do not drink alcohol if: ?Your doctor tells you not to drink. ?You are pregnant, may be pregnant, or are planning to become pregnant. ?If you drink alcohol: ?Limit how much you have to: ?0-1 drink a day for women. ?0-2 drinks   a day for men. ?Know how much alcohol is in your drink. In the [place] one drink equals one 12 oz bottle of beer (355 mL), one 5 oz glass of wine (148 mL), or one 1? oz glass of hard liquor (44 mL). ?General instructions ?Keep a weight-loss journal. This can help you keep track of: ?The food that you eat. ?How much exercise you  get. ?Take over-the-counter and prescription medicines only as told by your doctor. ?Take vitamins and supplements only as told by your doctor. ?Think about joining a support group. ?Pay attention to your mental health as obesity can lead to depression or self esteem issues. ?Keep all follow-up visits. ?Contact a doctor if: ?You cannot meet your weight-loss goal after you have changed your diet and lifestyle for 6 weeks. ?You are having trouble breathing. ?Summary ?Obesity is having too much body fat. ?Being obese means that your weight is more than what is healthy for you. ?Work with your doctor to set a weight-loss goal. ?Get regular exercise as told by your doctor. ?This information is not intended to replace advice given to you by your health care provider. Make sure you discuss any questions you have with your health care provider. ?Document Revised: 03/28/2021 Document Reviewed: 03/28/2021 ?Elsevier Patient Education ? 2023 Elsevier Inc. ? ?

## 2022-03-18 ENCOUNTER — Encounter: Payer: Self-pay | Admitting: Nurse Practitioner

## 2022-04-05 ENCOUNTER — Encounter: Payer: Self-pay | Admitting: Nurse Practitioner

## 2022-04-11 ENCOUNTER — Ambulatory Visit: Payer: No Typology Code available for payment source | Admitting: Nurse Practitioner

## 2022-04-21 ENCOUNTER — Other Ambulatory Visit: Payer: Self-pay | Admitting: Nurse Practitioner

## 2022-04-21 DIAGNOSIS — I10 Essential (primary) hypertension: Secondary | ICD-10-CM

## 2022-06-07 ENCOUNTER — Ambulatory Visit: Payer: Managed Care, Other (non HMO) | Admitting: Nurse Practitioner

## 2022-06-07 ENCOUNTER — Encounter: Payer: Self-pay | Admitting: Nurse Practitioner

## 2022-06-07 VITALS — BP 128/74 | HR 74 | Temp 97.9°F | Ht 66.0 in | Wt 222.4 lb

## 2022-06-07 DIAGNOSIS — I10 Essential (primary) hypertension: Secondary | ICD-10-CM

## 2022-06-07 DIAGNOSIS — E661 Drug-induced obesity: Secondary | ICD-10-CM

## 2022-06-07 DIAGNOSIS — R7309 Other abnormal glucose: Secondary | ICD-10-CM | POA: Diagnosis not present

## 2022-06-07 DIAGNOSIS — Z6835 Body mass index (BMI) 35.0-35.9, adult: Secondary | ICD-10-CM

## 2022-06-07 DIAGNOSIS — E7800 Pure hypercholesterolemia, unspecified: Secondary | ICD-10-CM

## 2022-06-07 NOTE — Progress Notes (Signed)
I,Tianna Badgett,acting as a Education administrator for [employer], FNP.,have documented all relevant documentation on the behalf of Minette Brine, FNP,as directed by  Minette Brine, FNP while in the presence of Minette Brine, [place].  Subjective:     Patient ID: Sheryl Garcia , female    DOB: 25-Aug-1981 , 41 y.o.   MRN: [id number]   Chief Complaint  Patient presents with   Prediabetes    HPI  She is here for follow up for htn and prediabetes follow up. She would like to get her flu shot at work.   Wt Readings from Last 3 Encounters: 06/07/22 : 222 lb 6.4 oz (100.9 kg) 03/05/22 : 242 lb 3.2 oz (109.9 kg) 12/13/21 : 268 lb (121.6 kg)  She has not had Mounjaro 2 months ago due to the copay card ran out. She is intermittent fasting and exercising 5-6 days a week. She is doing [employer] and [employer] with circuit training.      Past Medical History:  Diagnosis Date   GERD (gastroesophageal reflux disease)      History reviewed. No pertinent family history.   Current Outpatient Medications:    Magnesium 200 MG TABS, Take daily with evening meal (Patient taking differently: Take daily with evening meal. Pt taking 527m daily), Disp: 30 tablet, Rfl: 3   olmesartan (BENICAR) 20 MG tablet, Take 20 mg by mouth daily., Disp: , Rfl:    tirzepatide (MOUNJARO) 7.5 MG/0.5ML Pen, Inject 7.5 mg into the skin once a week., Disp: 6 mL, Rfl: 0   No Known Allergies   Review of Systems  Constitutional: Negative.   Respiratory: Negative.    Cardiovascular: Negative.   Gastrointestinal: Negative.   Neurological: Negative.   Psychiatric/Behavioral: Negative.       Today's Vitals   06/07/22 0907  BP: 128/74  Pulse: 74  Temp: 97.9 F (36.6 C)  TempSrc: Oral  Weight: 222 lb 6.4 oz (100.9 kg)  Height: 5' 6"  (1.676 m)   Body mass index is 35.9 kg/m.  Wt Readings from Last 3 Encounters:  06/07/22 222 lb 6.4 oz (100.9 kg)  03/05/22 242 lb 3.2 oz (109.9 kg)  12/13/21 268 lb (121.6 kg)     Objective:  Physical Exam Vitals reviewed.  Constitutional:      General: She is not in acute distress.    Appearance: Normal appearance. She is obese.  Cardiovascular:     Rate and Rhythm: Normal rate and regular rhythm.     Pulses: Normal pulses.     Heart sounds: Normal heart sounds. No murmur heard. Pulmonary:     Effort: Pulmonary effort is normal. No respiratory distress.     Breath sounds: Normal breath sounds. No wheezing.  Musculoskeletal:     Cervical back: Normal range of motion and neck supple.  Skin:    General: Skin is warm and dry.     Capillary Refill: Capillary refill takes less than 2 seconds.  Neurological:     General: No focal deficit present.     Mental Status: She is alert and oriented to person, place, and time.     Cranial Nerves: No cranial nerve deficit.     Motor: No weakness.  Psychiatric:        Mood and Affect: Mood normal.        Behavior: Behavior normal.        Thought Content: Thought content normal.        Judgment: Judgment normal.  Assessment And Plan:     1. Essential hypertension Comments: Blood pressure is controlled, continue current.  - BMP8+EGFR  2. Abnormal glucose Comments: HgbA1c was improved at last visit, she has lost approximately 90 lbs since October 2022.  - BMP8+EGFR - Lipid panel  3. Elevated cholesterol Comments: Cholesterol levels are stable, continue focusing on low fat diet.   4. Class 2 drug-induced obesity with serious comorbidity and body mass index (BMI) of 35.0 to 35.9 in adult Congratulated her on her 90lb weight loss since October 2022, she has been consistent with her diet and exercise. She is encouraged to strive for BMI less than 30 to decrease cardiac risk. Advised to aim for at least 150 minutes of exercise per week.   Patient was given opportunity to ask questions. Patient verbalized understanding of the plan and was able to repeat key elements of the plan. All questions were answered  to their satisfaction.  Minette Brine, FNP   I, Minette Brine, FNP, have reviewed all documentation for this visit. The documentation on 06/07/22 for the exam, diagnosis, procedures, and orders are all accurate and complete.   IF YOU HAVE BEEN REFERRED TO A SPECIALIST, IT MAY TAKE 1-2 WEEKS TO SCHEDULE/PROCESS THE REFERRAL. IF YOU HAVE NOT HEARD FROM US/SPECIALIST IN TWO WEEKS, PLEASE GIVE [place] A CALL AT [phone].   THE PATIENT IS ENCOURAGED TO PRACTICE SOCIAL DISTANCING DUE TO THE COVID-19 PANDEMIC.

## 2022-06-07 NOTE — Patient Instructions (Signed)
Preventing Type 2 Diabetes Mellitus Type 2 diabetes, also called type 2 diabetes mellitus, is a long-term (chronic) disease that affects sugar (glucose) levels in your blood. Normally, a hormone called insulin allows glucose to enter cells in your body. The cells use glucose for energy. With type 2 diabetes, you will have one or both of these problems: Your pancreas does not make enough insulin. Cells in your body do not respond properly to insulin that your body makes (insulin resistance). Insulin resistance or lack of insulin causes extra glucose to build up in the blood instead of going into cells. As a result, high blood glucose (hyperglycemia) develops. That can cause many complications. Being overweight or obese and having an inactive (sedentary) lifestyle can increase your risk for diabetes. Type 2 diabetes can be delayed or prevented by making certain nutrition and lifestyle changes. How can this condition affect me? If you do not take steps to prevent diabetes, your blood glucose levels may keep increasing over time. Too much glucose in your blood for a long time can damage your blood vessels, heart, kidneys, nerves, and eyes. Type 2 diabetes can lead to chronic health problems and complications, such as: Heart disease. Stroke. Blindness. Kidney disease. Depression. Poor circulation in your feet and legs. In severe cases, a foot or leg may need to be surgically removed (amputated). What can increase my risk? You may be more likely to develop type 2 diabetes if you: Have type 2 diabetes in your family. Are overweight or obese. Have a sedentary lifestyle. Have insulin resistance or a history of prediabetes. Have a history of pregnancy-related (gestational) diabetes or polycystic ovary syndrome (PCOS). What actions can I take to prevent this? It can be difficult to recognize signs of type 2 diabetes. Taking action to prevent the disease before you develop symptoms is the best way to avoid  possible damage to your body. Making certain nutrition and lifestyle changes may prevent or delay the disease and related health problems. Nutrition  Eat healthy meals and snacks regularly. Do not skip meals. Fruit or a handful of nuts is a healthy snack between meals. Drink water throughout the day. Avoid drinks that contain added sugar, such as soda or sweetened tea. Drink enough fluid to keep your urine pale yellow. Follow instructions from your health care provider about eating or drinking restrictions. Limit the amount of food you eat by: Managing how much you eat at a time (portion size). Checking food labels for the serving sizes of food. Using a kitchen scale to weigh amounts of food. Saut or steam food instead of frying it. Cook with water or broth instead of oils or butter. Limit saturated fat and salt (sodium) in your diet. Have no more than 1 tsp (2,400 mg) of sodium a day. If you have heart disease or high blood pressure, use less than ? tsp (1,500 mg) of sodium a day. Lifestyle  Lose weight if needed and as told. Your health care provider can determine how much weight loss is best for you and can help you lose weight safely. If you are overweight or obese, you may be told to lose at least 5?7% of your body weight. Manage blood pressure, cholesterol, and stress. Your health care provider will help determine the best treatment for you. Do not use any products that contain nicotine or tobacco. These products include cigarettes, chewing tobacco, and vaping devices, such as e-cigarettes. If you need help quitting, ask your health care provider. Activity  Do physical   activity that makes your heart beat faster and makes you sweat (moderate intensity). Do this for at least 30 minutes on at least 5 days of the week, or as much as told by your health care provider. Ask your health care provider what activities are safe for you. A mix of activities may be best, such as walking, swimming,  cycling, and strength training. Try to add physical activity into your day. For example: Park your car farther away than usual so that you walk more. Take a walk during your lunch break. Use stairs instead of elevators or escalators. Walk or bike to work instead of driving. Alcohol use If you drink alcohol: Limit how much you have to: 0?1 drink a day for women who are not pregnant. 0?2 drinks a day for men. Know how much alcohol is in your drink. In the [place] one drink equals one 12 oz bottle of beer (355 mL), one 5 oz glass of wine (148 mL), or one 1 oz glass of hard liquor (44 mL). General information Talk with your health care provider about your risk factors and how you can reduce your risk for diabetes. Have your blood glucose tested regularly, as told by your health care provider. Get screening tests as told by your health care provider. You may have these regularly, especially if you have certain risk factors for type 2 diabetes. Make an appointment with a registered dietitian. This diet and nutrition specialist can help you make a healthy eating plan and help you understand portion sizes and food labels. Where to find support Ask your health care provider to recommend a registered dietitian, a certified diabetes care and education specialist, or a weight loss program. Look for local or online weight loss groups. Join a gym, fitness club, or outdoor activity group, such as a walking club. Where to find more information For help and guidance and to learn more about diabetes and diabetes prevention, visit: American Diabetes Association (ADA): www.diabetes.org National Institute of Diabetes and Digestive and Kidney Diseases: www.niddk.nih.gov To learn more about healthy eating, visit: U.S. Department of Agriculture (USDA): www.choosemyplate.gov Office of Disease Prevention and Health Promotion (ODPHP): health.gov Summary You can delay or prevent type 2 diabetes by eating healthy  foods, losing weight if needed, and increasing your physical activity. Talk with your health care provider about your risk factors for type 2 diabetes and how you can reduce your risk. It can be difficult to recognize the signs of type 2 diabetes. The best way to avoid possible damage to your body is to take action to prevent the disease before you develop symptoms. Get screening tests as told by your health care provider. This information is not intended to replace advice given to you by your health care provider. Make sure you discuss any questions you have with your health care provider. Document Revised: 11/14/2020 Document Reviewed: 11/14/2020 Elsevier Patient Education  2023 Elsevier Inc.  

## 2022-06-08 LAB — BMP8+EGFR
BUN/Creatinine Ratio: 19 (ref 9–23)
BUN: 11 mg/dL (ref 6–24)
CO2: 20 mmol/L (ref 20–29)
Calcium: 10.6 mg/dL — ABNORMAL HIGH (ref 8.7–10.2)
Chloride: 102 mmol/L (ref 96–106)
Creatinine, Ser: 0.59 mg/dL (ref 0.57–1.00)
Glucose: 81 mg/dL (ref 70–99)
Potassium: 4.6 mmol/L (ref 3.5–5.2)
Sodium: 137 mmol/L (ref 134–144)
eGFR: 115 mL/min/{1.73_m2} (ref >59)

## 2022-07-03 ENCOUNTER — Ambulatory Visit: Payer: Managed Care, Other (non HMO)

## 2022-07-24 ENCOUNTER — Ambulatory Visit: Payer: Managed Care, Other (non HMO)

## 2022-07-24 VITALS — BP 122/78 | HR 60 | Temp 98.1°F

## 2022-07-24 DIAGNOSIS — I10 Essential (primary) hypertension: Secondary | ICD-10-CM

## 2022-07-24 NOTE — Progress Notes (Signed)
Patient presents for BPC. She is currently taking Olmesartan 20mg  and Magnesium 200mg .   BP Readings from Last 3 Encounters:  07/24/22 122/78  06/07/22 128/74  03/05/22 120/64   At her last visit she was taken off the HCTZ.

## 2022-07-29 ENCOUNTER — Other Ambulatory Visit: Payer: Self-pay | Admitting: Nurse Practitioner

## 2022-07-29 DIAGNOSIS — I10 Essential (primary) hypertension: Secondary | ICD-10-CM

## 2022-08-13 ENCOUNTER — Encounter: Payer: Self-pay | Admitting: Gynecology

## 2022-08-13 DIAGNOSIS — Z1231 Encounter for screening mammogram for malignant neoplasm of breast: Secondary | ICD-10-CM

## 2022-08-14 ENCOUNTER — Other Ambulatory Visit: Payer: Self-pay | Admitting: Gynecology

## 2022-08-14 DIAGNOSIS — R928 Other abnormal and inconclusive findings on diagnostic imaging of breast: Secondary | ICD-10-CM

## 2022-08-23 ENCOUNTER — Ambulatory Visit: Admission: RE | Admit: 2022-08-23 | Payer: No Typology Code available for payment source | Source: Ambulatory Visit

## 2022-08-23 ENCOUNTER — Ambulatory Visit
Admission: RE | Admit: 2022-08-23 | Discharge: 2022-08-23 | Disposition: A | Discharge status: Home / Self Care | Payer: Managed Care, Other (non HMO) | Source: Ambulatory Visit | Attending: Gynecology | Admitting: Gynecology

## 2022-08-23 DIAGNOSIS — R928 Other abnormal and inconclusive findings on diagnostic imaging of breast: Secondary | ICD-10-CM

## 2022-08-29 ENCOUNTER — Encounter: Payer: Self-pay | Admitting: Nurse Practitioner

## 2022-08-29 ENCOUNTER — Ambulatory Visit (INDEPENDENT_AMBULATORY_CARE_PROVIDER_SITE_OTHER): Payer: Managed Care, Other (non HMO) | Admitting: Nurse Practitioner

## 2022-08-29 VITALS — BP 128/74 | HR 66 | Temp 98.3°F | Ht 66.0 in | Wt 216.0 lb

## 2022-08-29 DIAGNOSIS — E7800 Pure hypercholesterolemia, unspecified: Secondary | ICD-10-CM

## 2022-08-29 DIAGNOSIS — Z0000 Encounter for general adult medical examination without abnormal findings: Secondary | ICD-10-CM | POA: Diagnosis not present

## 2022-08-29 DIAGNOSIS — I10 Essential (primary) hypertension: Secondary | ICD-10-CM | POA: Diagnosis not present

## 2022-08-29 DIAGNOSIS — E66811 Obesity, class 1: Secondary | ICD-10-CM

## 2022-08-29 DIAGNOSIS — Z6834 Body mass index (BMI) 34.0-34.9, adult: Secondary | ICD-10-CM

## 2022-08-29 DIAGNOSIS — E6609 Other obesity due to excess calories: Secondary | ICD-10-CM

## 2022-08-29 DIAGNOSIS — R7303 Prediabetes: Secondary | ICD-10-CM

## 2022-08-29 DIAGNOSIS — Z2821 Immunization not carried out because of patient refusal: Secondary | ICD-10-CM

## 2022-08-29 DIAGNOSIS — Z79899 Other long term (current) drug therapy: Secondary | ICD-10-CM

## 2022-08-29 LAB — POCT URINALYSIS DIPSTICK
Bilirubin, UA: NEGATIVE
Blood, UA: NEGATIVE
Glucose, UA: NEGATIVE
Ketones, UA: NEGATIVE
Leukocytes, UA: NEGATIVE
Nitrite, UA: NEGATIVE
Protein, UA: NEGATIVE
Spec Grav, UA: 1.02 (ref 1.010–1.025)
Urobilinogen, UA: 0.2 E.U./dL
pH, UA: 5.5 (ref 5.0–8.0)

## 2022-08-29 MED ORDER — OLMESARTAN MEDOXOMIL 20 MG PO TABS: 20.0000 mg | ORAL_TABLET | Freq: Every day | ORAL | 1 refills | Status: DC

## 2022-08-29 NOTE — Patient Instructions (Signed)

## 2022-08-29 NOTE — Progress Notes (Signed)
I,Tianna Badgett,acting as a Education administrator for [employer], FNP.,have documented all relevant documentation on the behalf of Minette Brine, FNP,as directed by  Minette Brine, FNP while in the presence of Minette Brine, [place].   Subjective:     Patient ID: Sheryl Garcia , female    DOB: 1981/08/13 , 41 y.o.   MRN: [id number]   Chief Complaint  Patient presents with   Annual Exam    HPI  Patient here for HM. Patient goes to [facility].    Wt Readings from Last 3 Encounters: 08/29/22 : 216 lb (98 kg) 06/07/22 : 222 lb 6.4 oz (100.9 kg) 03/05/22 : 242 lb 3.2 oz (109.9 kg)       Past Medical History:  Diagnosis Date   GERD (gastroesophageal reflux disease)      History reviewed. No pertinent family history.   Current Outpatient Medications:    Magnesium 200 MG TABS, Take daily with evening meal (Patient taking differently: Take daily with evening meal. Pt taking 598m daily), Disp: 30 tablet, Rfl: 3   olmesartan (BENICAR) 20 MG tablet, Take 1 tablet (20 mg total) by mouth daily., Disp: 90 tablet, Rfl: 1   No Known Allergies    The patient states she uses none and essure  for birth control.  Patient's last menstrual period was 08/16/2022.. Negative for Dysmenorrhea and Negative for Menorrhagia. Negative for: breast discharge, breast lump(s), breast pain and breast self exam. Associated symptoms include abnormal vaginal bleeding. Pertinent negatives include abnormal bleeding (hematology), anxiety, decreased libido, depression, difficulty falling sleep, dyspareunia, history of infertility, nocturia, sexual dysfunction, sleep disturbances, urinary incontinence, urinary urgency, vaginal discharge and vaginal itching. Diet regular - she does intermittent fasting. The patient states her exercise level is moderate with 5 days a week.   The patient's tobacco use is:  Social History   Tobacco Use  Smoking Status Never  Smokeless Tobacco Never  . She has been exposed to passive smoke.  The patient's alcohol use is:  Social History   Substance and Sexual Activity  Alcohol Use No   Additional information: Last pap 05/11/2021 had one done this year at [facility] next one scheduled for 2024.    Review of Systems  Constitutional: Negative.   HENT: Negative.    Eyes: Negative.   Respiratory: Negative.    Cardiovascular: Negative.   Gastrointestinal: Negative.   Endocrine: Negative.   Genitourinary: Negative.   Musculoskeletal: Negative.   Skin: Negative.   Allergic/Immunologic: Negative.   Neurological: Negative.   Hematological: Negative.   Psychiatric/Behavioral: Negative.       Today's Vitals   08/29/22 0904  BP: 128/74  Pulse: 66  Temp: 98.3 F (36.8 C)  TempSrc: Oral  Weight: 216 lb (98 kg)  Height: _0  (1.676 m)   Body mass index is 34.86 kg/m.  Wt Readings from Last 3 Encounters:  08/29/22 216 lb (98 kg)  06/07/22 222 lb 6.4 oz (100.9 kg)  03/05/22 242 lb 3.2 oz (109.9 kg)    Objective:  Physical Exam Vitals reviewed.  Constitutional:      General: She is not in acute distress.    Appearance: Normal appearance. She is well-developed. She is obese.  HENT:     Head: Normocephalic and atraumatic.     Right Ear: Hearing, tympanic membrane, ear canal and external ear normal. There is no impacted cerumen.     Left Ear: Hearing, tympanic membrane, ear canal and external ear normal. There is no impacted cerumen.  Nose:     Comments: Deferred - masked    Mouth/Throat:     Comments: Deferred - masked Eyes:     General: Lids are normal.     Extraocular Movements: Extraocular movements intact.     Conjunctiva/sclera: Conjunctivae normal.     Pupils: Pupils are equal, round, and reactive to light.     Funduscopic exam:    Right eye: No papilledema.        Left eye: No papilledema.  Neck:     Thyroid: No thyroid mass.     Vascular: No carotid bruit.  Cardiovascular:     Rate and Rhythm: Normal rate and regular rhythm.     Pulses:  Normal pulses.     Heart sounds: Normal heart sounds. No murmur heard. Pulmonary:     Effort: Pulmonary effort is normal. No respiratory distress.     Breath sounds: Normal breath sounds. No wheezing.  Chest:     Chest wall: No mass.  Breasts:    Tanner Score is 5.     Right: Normal. No mass or tenderness.     Left: Normal. No mass or tenderness.  Abdominal:     General: Abdomen is flat. Bowel sounds are normal. There is no distension.     Palpations: Abdomen is soft.     Tenderness: There is no abdominal tenderness.  Genitourinary:    Rectum: Guaiac result negative.  Musculoskeletal:        General: No swelling. Normal range of motion.     Cervical back: Full passive range of motion without pain, normal range of motion and neck supple.     Right lower leg: No edema.     Left lower leg: No edema.  Lymphadenopathy:     Upper Body:     Right upper body: No supraclavicular, axillary or pectoral adenopathy.     Left upper body: No supraclavicular, axillary or pectoral adenopathy.  Skin:    General: Skin is warm and dry.     Capillary Refill: Capillary refill takes less than 2 seconds.  Neurological:     General: No focal deficit present.     Mental Status: She is alert and oriented to person, place, and time.     Cranial Nerves: No cranial nerve deficit.     Sensory: No sensory deficit.  Psychiatric:        Mood and Affect: Mood normal.        Behavior: Behavior normal.        Thought Content: Thought content normal.        Judgment: Judgment normal.         Assessment And Plan:     1. Encounter for annual physical exam Behavior modifications discussed and diet history reviewed.   Pt will continue to exercise regularly and modify diet with low GI, plant based foods and decrease intake of processed foods.  Recommend intake of daily multivitamin, Vitamin D, and calcium.  Recommend mammogram for preventive screenings, as well as recommend immunizations that include  influenza, TDAP  2. Influenza vaccination declined Patient declined influenza vaccination at this time. Patient is aware that influenza vaccine prevents illness in 70% of healthy people, and reduces hospitalizations to 30-70% in elderly. This vaccine is recommended annually. Education has been provided regarding the importance of this vaccine but patient still declined. Advised may receive this vaccine at local pharmacy or Health Dept.or vaccine clinic. Aware to provide a copy of the vaccination record if obtained from local  pharmacy or Health Dept.  Pt is willing to accept risk associated with refusing vaccination.  3. Class 1 obesity due to excess calories with serious comorbidity and body mass index (BMI) of 34.0 to 34.9 in adult She is encouraged to strive for BMI less than 30 to decrease cardiac risk. Advised to aim for at least 150 minutes of exercise per week.  4. Essential hypertension Comments: Blood pressure is well controlled. Continue current medications - Microalbumin / Creatinine Urine Ratio - POCT Urinalysis Dipstick (81002) - EKG 12-Lead - olmesartan (BENICAR) 20 MG tablet; Take 1 tablet (20 mg total) by mouth daily.  Dispense: 90 tablet; Refill: 1  5. Elevated cholesterol Comments: Cholesterol levels were slightly elevated at last visit, will check lipid panel today. Continue low fat diet - Lipid panel  6. Prediabetes Comments: Continue focusing on healthy diet low in sugar and starches. Continue exercising regularly - Hemoglobin A1c - CMP14+EGFR  7. Other long term (current) drug therapy - CBC  8. Serum calcium elevated Comments: She has had an SPEP done in 2008 and seen Hematology with no abnormal findings. - Multiple Myeloma Panel (SPEP&IFE w/QIG)   Patient was given opportunity to ask questions. Patient verbalized understanding of the plan and was able to repeat key elements of the plan. All questions were answered to their satisfaction.   Minette Brine, FNP    I, Minette Brine, FNP, have reviewed all documentation for this visit. The documentation on 08/29/22 for the exam, diagnosis, procedures, and orders are all accurate and complete.   THE PATIENT IS ENCOURAGED TO PRACTICE SOCIAL DISTANCING DUE TO THE COVID-19 PANDEMIC.

## 2022-08-31 ENCOUNTER — Encounter: Payer: Self-pay | Admitting: Nurse Practitioner

## 2022-09-04 LAB — CMP14+EGFR
ALT: 19 IU/L (ref 0–32)
AST: 22 IU/L (ref 0–40)
Albumin/Globulin Ratio: 1.4 (ref 1.2–2.2)
Albumin: 4 g/dL (ref 3.9–4.9)
Alkaline Phosphatase: 75 IU/L (ref 44–121)
BUN/Creatinine Ratio: 16 (ref 9–23)
BUN: 12 mg/dL (ref 6–24)
Bilirubin Total: 0.3 mg/dL (ref 0.0–1.2)
CO2: 21 mmol/L (ref 20–29)
Calcium: 10.4 mg/dL — ABNORMAL HIGH (ref 8.7–10.2)
Chloride: 101 mmol/L (ref 96–106)
Creatinine, Ser: 0.73 mg/dL (ref 0.57–1.00)
Globulin, Total: 2.9 g/dL (ref 1.5–4.5)
Glucose: 79 mg/dL (ref 70–99)
Potassium: 4.2 mmol/L (ref 3.5–5.2)
Sodium: 138 mmol/L (ref 134–144)
Total Protein: 6.9 g/dL (ref 6.0–8.5)
eGFR: 105 mL/min/{1.73_m2} (ref >59)

## 2022-09-04 LAB — MULTIPLE MYELOMA PANEL, SERUM
Albumin SerPl Elph-Mcnc: 3.6 g/dL (ref 2.9–4.4)
Albumin/Glob SerPl: 1.1 (ref 0.7–1.7)
Alpha 1: 0.2 g/dL (ref 0.0–0.4)
Alpha2 Glob SerPl Elph-Mcnc: 0.6 g/dL (ref 0.4–1.0)
B-Globulin SerPl Elph-Mcnc: 1.1 g/dL (ref 0.7–1.3)
Gamma Glob SerPl Elph-Mcnc: 1.5 g/dL (ref 0.4–1.8)
Globulin, Total: 3.3 g/dL (ref 2.2–3.9)
IgA/Immunoglobulin A, Serum: 190 mg/dL (ref 87–352)
IgG (Immunoglobin G), Serum: 1645 mg/dL — ABNORMAL HIGH (ref 586–1602)
IgM (Immunoglobulin M), Srm: 84 mg/dL (ref 26–217)

## 2022-09-04 LAB — LIPID PANEL
Chol/HDL Ratio: 4.9 ratio — ABNORMAL HIGH (ref 0.0–4.4)
Cholesterol, Total: 279 mg/dL — ABNORMAL HIGH (ref 100–199)
HDL: 57 mg/dL (ref >39)
LDL Chol Calc (NIH): 215 mg/dL — ABNORMAL HIGH (ref 0–99)
Triglycerides: 50 mg/dL (ref 0–149)
VLDL Cholesterol Cal: 7 mg/dL (ref 5–40)

## 2022-09-04 LAB — CBC
Hematocrit: 37.7 % (ref 34.0–46.6)
Hemoglobin: 12.3 g/dL (ref 11.1–15.9)
MCH: 28 pg (ref 26.6–33.0)
MCHC: 32.6 g/dL (ref 31.5–35.7)
MCV: 86 fL (ref 79–97)
Platelets: 357 10*3/uL (ref 150–450)
RBC: 4.4 x10E6/uL (ref 3.77–5.28)
RDW: 13.3 % (ref 11.7–15.4)
WBC: 6.3 10*3/uL (ref 3.4–10.8)

## 2022-09-04 LAB — HEMOGLOBIN A1C
Est. average glucose Bld gHb Est-mCnc: 108 mg/dL
Hgb A1c MFr Bld: 5.4 % (ref 4.8–5.6)

## 2022-09-04 LAB — MICROALBUMIN / CREATININE URINE RATIO
Creatinine, Urine: 76.4 mg/dL
Microalb/Creat Ratio: 18 mg/g creat (ref 0–29)
Microalbumin, Urine: 13.7 ug/mL

## 2022-10-11 ENCOUNTER — Encounter: Payer: Self-pay | Admitting: Nurse Practitioner

## 2022-10-29 NOTE — Telephone Encounter (Signed)
Patient called to follow up, states she has not received response from MyChart message or her lab results.  Please advise, thank you!

## 2022-11-08 ENCOUNTER — Other Ambulatory Visit: Payer: Self-pay | Admitting: Nurse Practitioner

## 2022-11-08 DIAGNOSIS — R9431 Abnormal electrocardiogram [ECG] [EKG]: Secondary | ICD-10-CM

## 2022-11-08 DIAGNOSIS — R799 Abnormal finding of blood chemistry, unspecified: Secondary | ICD-10-CM

## 2022-12-04 ENCOUNTER — Encounter: Payer: Self-pay | Admitting: Cardiology

## 2022-12-04 ENCOUNTER — Ambulatory Visit: Payer: Managed Care, Other (non HMO) | Attending: Cardiology | Admitting: Cardiology

## 2022-12-04 ENCOUNTER — Ambulatory Visit (INDEPENDENT_AMBULATORY_CARE_PROVIDER_SITE_OTHER): Payer: Managed Care, Other (non HMO)

## 2022-12-04 VITALS — BP 132/74 | HR 59 | Ht 65.0 in | Wt 216.0 lb

## 2022-12-04 DIAGNOSIS — E785 Hyperlipidemia, unspecified: Secondary | ICD-10-CM

## 2022-12-04 DIAGNOSIS — I2510 Atherosclerotic heart disease of native coronary artery without angina pectoris: Secondary | ICD-10-CM

## 2022-12-04 DIAGNOSIS — R001 Bradycardia, unspecified: Secondary | ICD-10-CM

## 2022-12-04 NOTE — Progress Notes (Signed)
Cardiology Office Note:    Date:  12/04/2022   ID:  Sheryl Garcia, DOB 1981/02/18, MRN [id number]  PCP:  Minette Brine, FNP  Cardiologist:  None  Electrophysiologist:  None   Referring MD: Minette Brine, FNP   " I am ok"   History of Present Illness:    Sheryl Garcia is a 42 y.o. female with a hx of hyperlipidemia, obesity here today to be evaluated for abnormal EKG at her PCP office showing marked bradycardia.  She has no specific complaints.  She tells me that she has had some muscle ache and pain when she started lifting weights but this is improving.  No shortness of breath.  Past Medical History:  Diagnosis Date   GERD (gastroesophageal reflux disease)     No past surgical history on file.  Current Medications: Current Meds  Medication Sig   Magnesium 200 MG TABS Take daily with evening meal (Patient taking differently: Take daily with evening meal. Pt taking 500mg  daily)   olmesartan (BENICAR) 20 MG tablet Take 1 tablet (20 mg total) by mouth daily.     Allergies:   Patient has no known allergies.   Social History   Socioeconomic History   Marital status: Significant Other    Spouse name: Not on file   Number of children: Not on file   Years of education: Not on file   Highest education level: Not on file  Occupational History   Not on file  Tobacco Use   Smoking status: Never   Smokeless tobacco: Never  Substance and Sexual Activity   Alcohol use: No   Drug use: No   Sexual activity: Yes  Other Topics Concern   Not on file  Social History Narrative   Not on file   Social Determinants of Health   Financial Resource Strain: Not on file  Food Insecurity: Not on file  Transportation Needs: Not on file  Physical Activity: Not on file  Stress: Not on file  Social Connections: Not on file     Family History: The patient's family history is not on file.  ROS:   Review of Systems  Constitution: Negative for decreased appetite, fever and weight  gain.  HENT: Negative for congestion, ear discharge, hoarse voice and sore throat.   Eyes: Negative for discharge, redness, vision loss in right eye and visual halos.  Cardiovascular: Negative for chest pain, dyspnea on exertion, leg swelling, orthopnea and palpitations.  Respiratory: Negative for cough, hemoptysis, shortness of breath and snoring.   Endocrine: Negative for heat intolerance and polyphagia.  Hematologic/Lymphatic: Negative for bleeding problem. Does not bruise/bleed easily.  Skin: Negative for flushing, nail changes, rash and suspicious lesions.  Musculoskeletal: Negative for arthritis, joint pain, muscle cramps, myalgias, neck pain and stiffness.  Gastrointestinal: Negative for abdominal pain, bowel incontinence, diarrhea and excessive appetite.  Genitourinary: Negative for decreased libido, genital sores and incomplete emptying.  Neurological: Negative for brief paralysis, focal weakness, headaches and loss of balance.  Psychiatric/Behavioral: Negative for altered mental status, depression and suicidal ideas.  Allergic/Immunologic: Negative for HIV exposure and persistent infections.    EKGs/Labs/Other Studies Reviewed:    The following studies were reviewed today:   EKG:  The ekg ordered today demonstrates sinus bradycardia compared to prior EKG patient still with sinus bradycardia but with improved heart rate.  Recent Labs: 08/29/2022: ALT 19; BUN 12; Creatinine, Ser 0.73; Hemoglobin 12.3; Platelets 357; Potassium 4.2; Sodium 138  Recent Lipid Panel    Component Value  Date/Time   CHOL 279 (H) 08/29/2022 0947   TRIG 50 08/29/2022 0947   HDL 57 08/29/2022 0947   CHOLHDL 4.9 (H) 08/29/2022 0947   LDLCALC 215 (H) 08/29/2022 0947    Physical Exam:    VS:  BP 132/74 (BP Location: Left Arm, Patient Position: Sitting, Cuff Size: Large)   Pulse (!) 59   Ht 5\' 5"  (1.651 m)   Wt 216 lb (98 kg)   LMP 11/12/2022   SpO2 98%   BMI 35.94 kg/m     Wt Readings from  Last 3 Encounters:  12/04/22 216 lb (98 kg)  08/29/22 216 lb (98 kg)  06/07/22 222 lb 6.4 oz (100.9 kg)     GEN: Well nourished, well developed in no acute distress HEENT: Normal NECK: No JVD; No carotid bruits LYMPHATICS: No lymphadenopathy CARDIAC: S1S2 noted,RRR, no murmurs, rubs, gallops RESPIRATORY:  Clear to auscultation without rales, wheezing or rhonchi  ABDOMEN: Soft, non-tender, non-distended, +bowel sounds, no guarding. EXTREMITIES: No edema, No cyanosis, no clubbing MUSCULOSKELETAL:  No deformity  SKIN: Warm and dry NEUROLOGIC:  Alert and oriented x 3, non-focal PSYCHIATRIC:  Normal affect, good insight  ASSESSMENT:    1. ASCVD (arteriosclerotic cardiovascular disease)   2. Hyperlipidemia, unspecified hyperlipidemia type   3. Bradycardia    PLAN:     Marked sinus bradycardia-this could be multiple reasons.  Will place a monitor on the patient to make sure that sinus node dysfunction is not playing a role here.  Will get TSH today.  Hyperlipidemia lipid profile in December 2023 shows total cholesterol 279, HDL 57, LDL 215, triglyceride 50.  She will need to be on lipid-lowering agent [place] directed as she is greater than 190.  Will repeat blood work as she tells me that she has really changed her diet since her last blood work.  If this is elevated we will get lipid lowering agent.  Advised the patient she will benefit from a cardiac calcium score she is agreeable for this at this time.  The patient understands the need to lose weight with diet and exercise. We have discussed specific strategies for this.  The patient is in agreement with the above plan. The patient left the office in stable condition.  The patient will follow up in   Medication Adjustments/Labs and Tests Ordered: Current medicines are reviewed at length with the patient today.  Concerns regarding medicines are outlined above.  Orders Placed This Encounter  Procedures   CT CARDIAC SCORING  (SELF PAY ONLY)   Lipid panel   TSH+T4F+T3Free   LONG TERM MONITOR (3-14 DAYS)   EKG 12-Lead   No orders of the defined types were placed in this encounter.   Patient Instructions  Medication Instructions:  Your physician recommends that you continue on your current medications as directed. Please refer to the Current Medication list given to you today.  *If you need a refill on your cardiac medications before your next appointment, please call your pharmacy*   Lab Work: Your physician recommends that you have labs drawn today: Lipids, TSH If you have labs (blood work) drawn today and your tests are completely normal, you will receive your results only by: [place] (if you have MyChart) OR A paper copy in the mail If you have any lab test that is abnormal or we need to change your treatment, we will call you to review the results.   Testing/Procedures: Dr. Harriet Masson has ordered a CT coronary calcium score.  Test locations:  [place]   This is $99 out of pocket.   Coronary CalciumScan A coronary calcium scan is an imaging test used to look for deposits of calcium and other fatty materials (plaques) in the inner lining of the blood vessels of the heart (coronary arteries). These deposits of calcium and plaques can partly clog and narrow the coronary arteries without producing any symptoms or warning signs. This puts a person at risk for a heart attack. This test can detect these deposits before symptoms develop. Tell a health care provider about: Any allergies you have. All medicines you are taking, including vitamins, herbs, eye drops, creams, and over-the-counter medicines. Any problems you or family members have had with anesthetic medicines. Any blood disorders you have. Any surgeries you have had. Any medical conditions you have. Whether you are pregnant or may be pregnant. What are the risks? Generally, this is a safe procedure.  However, problems may occur, including: Harm to a pregnant woman and her unborn baby. This test involves the use of radiation. Radiation exposure can be dangerous to a pregnant woman and her unborn baby. If you are pregnant, you generally should not have this procedure done. Slight increase in the risk of cancer. This is because of the radiation involved in the test. What happens before the procedure? No preparation is needed for this procedure. What happens during the procedure? You will undress and remove any jewelry around your neck or chest. You will put on a hospital gown. Sticky electrodes will be placed on your chest. The electrodes will be connected to an electrocardiogram (ECG) machine to record a tracing of the electrical activity of your heart. A CT scanner will take pictures of your heart. During this time, you will be asked to lie still and hold your breath for 2-3 seconds while a picture of your heart is being taken. The procedure may vary among health care providers and hospitals. What happens after the procedure? You can get dressed. You can return to your normal activities. It is up to you to get the results of your test. Ask your health care provider, or the department that is doing the test, when your results will be ready. Summary A coronary calcium scan is an imaging test used to look for deposits of calcium and other fatty materials (plaques) in the inner lining of the blood vessels of the heart (coronary arteries). Generally, this is a safe procedure. Tell your health care provider if you are pregnant or may be pregnant. No preparation is needed for this procedure. A CT scanner will take pictures of your heart. You can return to your normal activities after the scan is done. This information is not intended to replace advice given to you by your health care provider. Make sure you discuss any questions you have with your health care provider. Document Released: 02/16/2008  Document Revised: 07/09/2016 Document Reviewed: 07/09/2016 Elsevier Interactive Patient Education  2017 [place] Term Monitor Instructions  Your physician has requested you wear a ZIO patch monitor for 14 days.  This is a single patch monitor. [employer] supplies one patch monitor per enrollment. Additional stickers are not available. Please do not apply patch if you will be having a Nuclear Stress Test,  Echocardiogram, Cardiac CT, MRI, or Chest Xray during the period you would be wearing the  monitor. The patch cannot be worn during these tests. You cannot remove and re-apply the  ZIO XT patch  monitor.  Your ZIO patch monitor will be mailed 3 day USPS to your address on file. It may take 3-5 days  to receive your monitor after you have been enrolled.  Once you have received your monitor, please review the enclosed instructions. Your monitor  has already been registered assigning a specific monitor serial # to you.  Billing and Patient Assistance Program Information  We have supplied [employer] with any of your insurance information on file for billing purposes. [employer] offers a sliding scale Patient Assistance Program for patients that do not have  insurance, or whose insurance does not completely cover the cost of the ZIO monitor.  You must apply for the Patient Assistance Program to qualify for this discounted rate.  To apply, please call [employer] at [phone], select option 4, select option 2, ask to apply for  Patient Assistance Program. Theodore Demark will ask your household income, and how many people  are in your household. They will quote your out-of-pocket cost based on that information.  [employer] will also be able to set up a 24-month, interest-free payment plan if needed.  Applying the monitor   Shave hair from upper left chest.  Hold abrader disc by orange tab. Rub abrader in 40 strokes over the upper left chest as  indicated in your monitor instructions.  Clean  area with 4 enclosed alcohol pads. Let dry.  Apply patch as indicated in monitor instructions. Patch will be placed under collarbone on left  side of chest with arrow pointing upward.  Rub patch adhesive wings for 2 minutes. Remove white label marked "1". Remove the white  label marked "2". Rub patch adhesive wings for 2 additional minutes.  While looking in a mirror, press and release button in center of patch. A small green light will  flash 3-4 times. This will be your only indicator that the monitor has been turned on.  Do not shower for the first 24 hours. You may shower after the first 24 hours.  Press the button if you feel a symptom. You will hear a small click. Record Date, Time and  Symptom in the Patient Logbook.  When you are ready to remove the patch, follow instructions on the last 2 pages of Patient  Logbook. Stick patch monitor onto the last page of Patient Logbook.  Place Patient Logbook in the blue and white box. Use locking tab on box and tape box closed  securely. The blue and white box has prepaid postage on it. Please place it in the mailbox as  soon as possible. Your physician should have your test results approximately 7 days after the  monitor has been mailed back to [facility].  Call [place] at [phone] if you have questions regarding  your ZIO XT patch monitor. Call them immediately if you see an orange light blinking on your  monitor.  If your monitor falls off in less than 4 days, contact our Monitor department at [phone].  If your monitor becomes loose or falls off after 4 days call [employer] at [phone] for  suggestions on securing your monitor   Follow-Up: At [facility], you and your health needs are our priority.  As part of our continuing mission to provide you with exceptional heart care, we have created designated Provider Care Teams.  These Care Teams include your primary Cardiologist (physician) and  Advanced Practice Providers (APPs -  Physician Assistants and Nurse Practitioners) who all work together to provide you with the care you  need, when you need it.   Your next appointment:   16 week(s) via MyChart  Provider:   Berniece Salines, DO     Adopting a Healthy Lifestyle.  Know what a healthy weight is for you (roughly BMI <25) and aim to maintain this   Aim for 7+ servings of fruits and vegetables daily   65-80+ fluid ounces of water or unsweet tea for healthy kidneys   Limit to max 1 drink of alcohol per day; avoid smoking/tobacco   Limit animal fats in diet for cholesterol and heart health - choose grass fed whenever available   Avoid highly processed foods, and foods high in saturated/trans fats   Aim for low stress - take time to unwind and care for your mental health   Aim for 150 min of moderate intensity exercise weekly for heart health, and weights twice weekly for bone health   Aim for 7-9 hours of sleep daily   When it comes to diets, agreement about the perfect plan isnt easy to find, even among the experts. Experts at the [employer] developed an idea known as the Healthy Eating Plate. Just imagine a plate divided into logical, healthy portions.   The emphasis is on diet quality:   Load up on vegetables and fruits - one-half of your plate: Aim for color and variety, and remember that potatoes dont count.   Go for whole grains - one-quarter of your plate: Whole wheat, barley, wheat berries, quinoa, oats, brown rice, and foods made with them. If you want pasta, go with whole wheat pasta.   Protein power - one-quarter of your plate: Fish, chicken, beans, and nuts are all healthy, versatile protein sources. Limit red meat.   The diet, however, does go beyond the plate, offering a few other suggestions.   Use healthy plant oils, such as olive, canola, soy, corn, sunflower and peanut. Check the labels, and avoid partially hydrogenated oil,  which have unhealthy trans fats.   If youre thirsty, drink water. Coffee and tea are good in moderation, but skip sugary drinks and limit milk and dairy products to one or two daily servings.   The type of carbohydrate in the diet is more important than the amount. Some sources of carbohydrates, such as vegetables, fruits, whole grains, and beans-are healthier than others.   Finally, stay active  Signed, Berniece Salines, DO  12/04/2022 1:57 PM    [facility]

## 2022-12-04 NOTE — Progress Notes (Unsigned)
Enrolled for [employer] to mail a ZIO XT long term holter monitor to the patients address on file.  

## 2022-12-04 NOTE — Patient Instructions (Signed)
Medication Instructions:  Your physician recommends that you continue on your current medications as directed. Please refer to the Current Medication list given to you today.  *If you need a refill on your cardiac medications before your next appointment, please call your pharmacy*   Lab Work: Your physician recommends that you have labs drawn today: Lipids, TSH If you have labs (blood work) drawn today and your tests are completely normal, you will receive your results only by: [place] (if you have MyChart) OR A paper copy in the mail If you have any lab test that is abnormal or we need to change your treatment, we will call you to review the results.   Testing/Procedures: Dr. Harriet Masson has ordered a CT coronary calcium score.   Test locations:  [place]   This is $99 out of pocket.   Coronary CalciumScan A coronary calcium scan is an imaging test used to look for deposits of calcium and other fatty materials (plaques) in the inner lining of the blood vessels of the heart (coronary arteries). These deposits of calcium and plaques can partly clog and narrow the coronary arteries without producing any symptoms or warning signs. This puts a person at risk for a heart attack. This test can detect these deposits before symptoms develop. Tell a health care provider about: Any allergies you have. All medicines you are taking, including vitamins, herbs, eye drops, creams, and over-the-counter medicines. Any problems you or family members have had with anesthetic medicines. Any blood disorders you have. Any surgeries you have had. Any medical conditions you have. Whether you are pregnant or may be pregnant. What are the risks? Generally, this is a safe procedure. However, problems may occur, including: Harm to a pregnant woman and her unborn baby. This test involves the use of radiation. Radiation exposure can be dangerous to a pregnant woman and her  unborn baby. If you are pregnant, you generally should not have this procedure done. Slight increase in the risk of cancer. This is because of the radiation involved in the test. What happens before the procedure? No preparation is needed for this procedure. What happens during the procedure? You will undress and remove any jewelry around your neck or chest. You will put on a hospital gown. Sticky electrodes will be placed on your chest. The electrodes will be connected to an electrocardiogram (ECG) machine to record a tracing of the electrical activity of your heart. A CT scanner will take pictures of your heart. During this time, you will be asked to lie still and hold your breath for 2-3 seconds while a picture of your heart is being taken. The procedure may vary among health care providers and hospitals. What happens after the procedure? You can get dressed. You can return to your normal activities. It is up to you to get the results of your test. Ask your health care provider, or the department that is doing the test, when your results will be ready. Summary A coronary calcium scan is an imaging test used to look for deposits of calcium and other fatty materials (plaques) in the inner lining of the blood vessels of the heart (coronary arteries). Generally, this is a safe procedure. Tell your health care provider if you are pregnant or may be pregnant. No preparation is needed for this procedure. A CT scanner will take pictures of your heart. You can return to your normal activities after the scan is done. This information is not intended to  replace advice given to you by your health care provider. Make sure you discuss any questions you have with your health care provider. Document Released: 02/16/2008 Document Revised: 07/09/2016 Document Reviewed: 07/09/2016 Elsevier Interactive Patient Education  2017 [place] Term Monitor Instructions  Your physician has  requested you wear a ZIO patch monitor for 14 days.  This is a single patch monitor. [employer] supplies one patch monitor per enrollment. Additional stickers are not available. Please do not apply patch if you will be having a Nuclear Stress Test,  Echocardiogram, Cardiac CT, MRI, or Chest Xray during the period you would be wearing the  monitor. The patch cannot be worn during these tests. You cannot remove and re-apply the  ZIO XT patch monitor.  Your ZIO patch monitor will be mailed 3 day USPS to your address on file. It may take 3-5 days  to receive your monitor after you have been enrolled.  Once you have received your monitor, please review the enclosed instructions. Your monitor  has already been registered assigning a specific monitor serial # to you.  Billing and Patient Assistance Program Information  We have supplied [employer] with any of your insurance information on file for billing purposes. [employer] offers a sliding scale Patient Assistance Program for patients that do not have  insurance, or whose insurance does not completely cover the cost of the ZIO monitor.  You must apply for the Patient Assistance Program to qualify for this discounted rate.  To apply, please call [employer] at [phone], select option 4, select option 2, ask to apply for  Patient Assistance Program. Theodore Demark will ask your household income, and how many people  are in your household. They will quote your out-of-pocket cost based on that information.  [employer] will also be able to set up a 29-month, interest-free payment plan if needed.  Applying the monitor   Shave hair from upper left chest.  Hold abrader disc by orange tab. Rub abrader in 40 strokes over the upper left chest as  indicated in your monitor instructions.  Clean area with 4 enclosed alcohol pads. Let dry.  Apply patch as indicated in monitor instructions. Patch will be placed under collarbone on left  side of chest with arrow pointing  upward.  Rub patch adhesive wings for 2 minutes. Remove white label marked "1". Remove the white  label marked "2". Rub patch adhesive wings for 2 additional minutes.  While looking in a mirror, press and release button in center of patch. A small green light will  flash 3-4 times. This will be your only indicator that the monitor has been turned on.  Do not shower for the first 24 hours. You may shower after the first 24 hours.  Press the button if you feel a symptom. You will hear a small click. Record Date, Time and  Symptom in the Patient Logbook.  When you are ready to remove the patch, follow instructions on the last 2 pages of Patient  Logbook. Stick patch monitor onto the last page of Patient Logbook.  Place Patient Logbook in the blue and white box. Use locking tab on box and tape box closed  securely. The blue and white box has prepaid postage on it. Please place it in the mailbox as  soon as possible. Your physician should have your test results approximately 7 days after the  monitor has been mailed back to [facility].  Call [facility] [place] at [phone] if you have  questions regarding  your ZIO XT patch monitor. Call them immediately if you see an orange light blinking on your  monitor.  If your monitor falls off in less than 4 days, contact our Monitor department at [phone].  If your monitor becomes loose or falls off after 4 days call [employer] at [phone] for  suggestions on securing your monitor   Follow-Up: At [facility], you and your health needs are our priority.  As part of our continuing mission to provide you with exceptional heart care, we have created designated Provider Care Teams.  These Care Teams include your primary Cardiologist (physician) and Advanced Practice Providers (APPs -  Physician Assistants and Nurse Practitioners) who all work together to provide you with the care you need, when you need it.   Your next  appointment:   16 week(s) via MyChart  Provider:   Berniece Salines, DO

## 2022-12-05 LAB — LIPID PANEL
Chol/HDL Ratio: 4.3 ratio (ref 0.0–4.4)
Cholesterol, Total: 247 mg/dL — ABNORMAL HIGH (ref 100–199)
HDL: 58 mg/dL (ref >39)
LDL Chol Calc (NIH): 182 mg/dL — ABNORMAL HIGH (ref 0–99)
Triglycerides: 48 mg/dL (ref 0–149)
VLDL Cholesterol Cal: 7 mg/dL (ref 5–40)

## 2022-12-05 LAB — TSH+T4F+T3FREE
Free T4: 1.3 ng/dL (ref 0.82–1.77)
T3, Free: 2.4 pg/mL (ref 2.0–4.4)
TSH: 0.673 u[IU]/mL (ref 0.450–4.500)

## 2022-12-07 ENCOUNTER — Ambulatory Visit (HOSPITAL_COMMUNITY)
Admission: RE | Admit: 2022-12-07 | Discharge: 2022-12-07 | Disposition: A | Discharge status: Home / Self Care | Payer: Managed Care, Other (non HMO) | Source: Ambulatory Visit | Attending: Cardiology | Admitting: Cardiology

## 2022-12-07 ENCOUNTER — Encounter: Payer: Self-pay | Admitting: Cardiology

## 2022-12-07 DIAGNOSIS — I2510 Atherosclerotic heart disease of native coronary artery without angina pectoris: Secondary | ICD-10-CM | POA: Insufficient documentation

## 2022-12-07 MED ORDER — ROSUVASTATIN CALCIUM 10 MG PO TABS: 10.0000 mg | ORAL_TABLET | Freq: Every day | ORAL | 3 refills | Status: DC

## 2022-12-07 NOTE — Telephone Encounter (Signed)
Kardie Tobb, DO 12/06/2022 10:15 PM EDT     Cholesterol still elevated you would benefit from lipid-lowering medicine.  I would like to start you on Crestor 10 mg daily.   Prescription sent to patient preferred pharmacy.

## 2022-12-09 DIAGNOSIS — R001 Bradycardia, unspecified: Secondary | ICD-10-CM

## 2022-12-31 ENCOUNTER — Encounter: Payer: Managed Care, Other (non HMO) | Admitting: Nurse Practitioner

## 2022-12-31 NOTE — Progress Notes (Signed)
NO SHOW

## 2023-01-08 ENCOUNTER — Ambulatory Visit: Payer: Managed Care, Other (non HMO) | Attending: Cardiology | Admitting: Cardiology

## 2023-01-08 VITALS — BP 116/68 | HR 71 | Ht 65.0 in | Wt 213.0 lb

## 2023-01-08 DIAGNOSIS — R0683 Snoring: Secondary | ICD-10-CM | POA: Diagnosis not present

## 2023-01-08 DIAGNOSIS — I4719 Other supraventricular tachycardia: Secondary | ICD-10-CM | POA: Diagnosis not present

## 2023-01-08 DIAGNOSIS — R400 Somnolence: Secondary | ICD-10-CM | POA: Diagnosis not present

## 2023-01-08 NOTE — Patient Instructions (Signed)
Medication Instructions:  Your physician recommends that you continue on your current medications as directed. Please refer to the Current Medication list given to you today.  *If you need a refill on your cardiac medications before your next appointment, please call your pharmacy*   Lab Work: None   Testing/Procedures: Your physician has recommended that you have a sleep study. This test records several body functions during sleep, including: brain activity, eye movement, oxygen and carbon dioxide blood levels, heart rate and rhythm, breathing rate and rhythm, the flow of air through your mouth and nose, snoring, body muscle movements, and chest and belly movement. - The sleep study staff will contact you to schedule your appointment.     Follow-Up: At [facility], you and your health needs are our priority.  As part of our continuing mission to provide you with exceptional heart care, we have created designated Provider Care Teams.  These Care Teams include your primary Cardiologist (physician) and Advanced Practice Providers (APPs -  Physician Assistants and Nurse Practitioners) who all work together to provide you with the care you need, when you need it.  We recommend signing up for the patient portal called "MyChart".  Sign up information is provided on this After Visit Summary.  MyChart is used to connect with patients for Virtual Visits (Telemedicine).  Patients are able to view lab/test results, encounter notes, upcoming appointments, etc.  Non-urgent messages can be sent to your provider as well.   To learn more about what you can do with MyChart, go to [email or web address].    Your next appointment:   1 year(s)  Provider:   Thomasene Ripple, DO

## 2023-01-08 NOTE — Progress Notes (Unsigned)
Virtual Visit via Video Note   Because of Sheryl Garcia's co-morbid illnesses, she is at least at moderate risk for complications without adequate follow up.  This format is felt to be most appropriate for this patient at this time.  All issues noted in this document were discussed and addressed.  A limited physical exam was performed with this format.  Please refer to the patient's chart for her consent to telehealth for [facility].      Date:  01/09/2023   ID:  Sheryl Garcia, DOB 06/16/1981, MRN [id number]  Patient Location: Home Provider Location: Office/Clinic  PCP:  Arnette Felts, FNP  Cardiologist:  Thomasene Ripple, DO  Electrophysiologist:  None   Evaluation Performed:  Follow-Up Visit  Chief Complaint:     History of Present Illness:    Sheryl Garcia is a 42 y.o. female with hyperlipidemia, obesity here today for follow-up visit.  She is here today for her follow-up visit for her monitor result.  She has had some palpitations but nothing is interfering with   The patient does not have symptoms concerning for COVID-19 infection (fever, chills, cough, or new shortness of breath).    Past Medical History:  Diagnosis Date   GERD (gastroesophageal reflux disease)    No past surgical history on file.   Current Meds  Medication Sig   Magnesium 200 MG TABS Take daily with evening meal (Patient taking differently: Take daily with evening meal. Pt taking 500mg  daily)   olmesartan (BENICAR) 20 MG tablet Take 1 tablet (20 mg total) by mouth daily.   rosuvastatin (CRESTOR) 10 MG tablet Take 1 tablet (10 mg total) by mouth daily.     Allergies:   Patient has no known allergies.   Social History   Tobacco Use   Smoking status: Never   Smokeless tobacco: Never  Substance Use Topics   Alcohol use: No   Drug use: No     Family Hx: The patient's family history is not on file.  ROS:   Please see the history of present illness.    Palpitations All other  systems reviewed and are negative.   Prior CV studies:   The following studies were reviewed today:  Zio monitor 12/31/2022 Patch Wear Time:  10 days and 10 hours (2024-04-07T12:08:22-0400 to 2024-04-17T22:38:11-398)   Patient had a min HR of 32 bpm, max HR of 197 bpm, and avg HR of 77 bpm. Predominant underlying rhythm was Sinus Rhythm. 1 run of Supraventricular Tachycardia occurred lasting 20.8 secs with a max rate of 197 bpm (avg 136 bpm). Second Degree AV Block-Mobitz I (Wenckebach) was present. Isolated SVEs were rare (<1.0%), SVE Couplets were rare (<1.0%), and SVE Triplets were rare (<1.0%). Isolated VEs were rare (<1.0%), and no VE Couplets or VE Triplets were present.    Symptoms associated with sinus rhythm.    Conclusion: This study showed evidence of the following:                        1. Paroxysmal Supraventricular tachycardia                       2. Second Degree AV Block-Mobitz I (Wenckebach) - occurred during the overnight hrs suggesting Sleep apnea.    Coronary calcium score: FINDINGS: Coronary arteries: Normal origins.   Coronary Calcium Score:   Left main: 0   Left anterior descending artery: 0   Left circumflex artery:  0   Right coronary artery: 0   Total: 0   Percentile: 0   Pericardium: Normal.   Aorta: Normal caliber. Aortic atherosclerosis near the ostium of the RCA.   Non-cardiac: See separate report from [facility].   IMPRESSION: 1. Coronary calcium score of 0. 2. Aortic atherosclerosis near the ostium of the RCA.  Labs/Other Tests and Data Reviewed:    EKG:  No ECG reviewed.  Recent Labs: 08/29/2022: ALT 19; BUN 12; Creatinine, Ser 0.73; Hemoglobin 12.3; Platelets 357; Potassium 4.2; Sodium 138 12/04/2022: TSH 0.673   Recent Lipid Panel Lab Results  Component Value Date/Time   CHOL 247 (H) 12/04/2022 02:03 PM   TRIG 48 12/04/2022 02:03 PM   HDL 58 12/04/2022 02:03 PM   CHOLHDL 4.3 12/04/2022 02:03 PM   LDLCALC 182 (H)  12/04/2022 02:03 PM    Wt Readings from Last 3 Encounters:  01/08/23 213 lb (96.6 kg)  12/04/22 216 lb (98 kg)  08/29/22 216 lb (98 kg)     Objective:    Vital Signs:  BP 116/68   Pulse 71   Ht 5\' 5"  (1.651 m)   Wt 213 lb (96.6 kg)   SpO2 99%   BMI 35.45 kg/m      ASSESSMENT & PLAN:    Paroxysmal supraventricular tachycardia Second-degree AV block type I Snoring  Daytime somnolence   We discussed her testing results.  She admits to snoring daytime somnolence.  With her second-degree type I AV block overnight suggesting sleep apnea.  In the symptoms I like to send the patient for sleep study. For now symptoms of palpitations are not interfering with her daily activity sheer decision we will hold off on starting any additional medication.  Agree to be a part of her African-American heart study.   COVID-19 Education: The signs and symptoms of COVID-19 were discussed with the patient and how to seek care for testing (follow up with PCP or arrange E-visit).  The importance of social distancing was discussed today.  Time:   Today, I have spent 15 minutes with the patient with telehealth technology discussing the above problems.     Medication Adjustments/Labs and Tests Ordered: Current medicines are reviewed at length with the patient today.  Concerns regarding medicines are outlined above.   Tests Ordered: Orders Placed This Encounter  Procedures   Split night study    Medication Changes: No orders of the defined types were placed in this encounter.   Follow Up:  In Person in 1 year(s)  Signed, Thomasene Ripple, DO  01/09/2023 10:04 AM    [facility]

## 2023-02-26 NOTE — Addendum Note (Signed)
Addended by: Brunetta Genera on: 02/26/2023 10:31 AM   Modules accepted: Orders

## 2023-04-01 ENCOUNTER — Encounter: Payer: Self-pay | Admitting: Cardiology

## 2023-04-01 ENCOUNTER — Ambulatory Visit: Payer: Managed Care, Other (non HMO) | Attending: Cardiology | Admitting: Cardiology

## 2023-04-01 ENCOUNTER — Telehealth: Payer: Self-pay

## 2023-04-01 VITALS — BP 160/88 | HR 78 | Ht 66.0 in | Wt 212.0 lb

## 2023-04-01 DIAGNOSIS — I10 Essential (primary) hypertension: Secondary | ICD-10-CM | POA: Diagnosis not present

## 2023-04-01 DIAGNOSIS — R400 Somnolence: Secondary | ICD-10-CM | POA: Diagnosis not present

## 2023-04-01 DIAGNOSIS — E785 Hyperlipidemia, unspecified: Secondary | ICD-10-CM

## 2023-04-01 DIAGNOSIS — I4719 Other supraventricular tachycardia: Secondary | ICD-10-CM

## 2023-04-01 NOTE — Progress Notes (Signed)
Virtual Visit via Video Note   Because of Sheryl Garcia's co-morbid illnesses, she is at least at moderate risk for complications without adequate follow up.  This format is felt to be most appropriate for this patient at this time.  All issues noted in this document were discussed and addressed.  A limited physical exam was performed with this format.  Please refer to the patient's chart for her consent to telehealth for [facility].      Date:  04/01/2023   ID:  Sheryl Garcia, Sheryl Garcia 05-16-1981, MRN [id number]  Patient Location: Home Provider Location: Office/Clinic  PCP:  Arnette Felts, FNP  Cardiologist:  Thomasene Ripple, DO  Electrophysiologist:  None   Evaluation Performed:  Follow-Up Visit  Chief Complaint:  " I forgot to take my medication today"   Virtual Visit via Video  Note . I connected with the patient today by a   video enabled telemedicine application and verified that I am speaking with the correct person using two identifiers.   History of Present Illness:    Sheryl Garcia is a 42 y.o. female with paroxysmal supraventricular tachycardia, second-degree AV block type I/Wenckebach, daytime somnolence, hypertension here today for follow-up visit.  At her last visit which was on Jan 08, 2023 was a virtual visit to discuss her monitor result.  At that time I sent the patient for sleep study.  Today she offers no complaints other than tell me that she forgot to take her antihypertensive medication.  The patient does not have symptoms concerning for COVID-19 infection (fever, chills, cough, or new shortness of breath).    Past Medical History:  Diagnosis Date   GERD (gastroesophageal reflux disease)    No past surgical history on file.   No outpatient medications have been marked as taking for the 04/01/23 encounter (Video Visit) with Roshini Fulwider, DO.     Allergies:   Patient has no known allergies.   Social History   Tobacco Use   Smoking status:  Never   Smokeless tobacco: Never  Substance Use Topics   Alcohol use: No   Drug use: No     Family Hx: The patient's family history is not on file.  ROS:   Please see the history of present illness.     All other systems reviewed and are negative.   Prior CV studies:   The following studies were reviewed today:  CT calcium scoring and ZIO monitor  Labs/Other Tests and Data Reviewed:    EKG:    Recent Labs: 08/29/2022: ALT 19; BUN 12; Creatinine, Ser 0.73; Hemoglobin 12.3; Platelets 357; Potassium 4.2; Sodium 138 12/04/2022: TSH 0.673   Recent Lipid Panel Lab Results  Component Value Date/Time   CHOL 247 (H) 12/04/2022 02:03 PM   TRIG 48 12/04/2022 02:03 PM   HDL 58 12/04/2022 02:03 PM   CHOLHDL 4.3 12/04/2022 02:03 PM   LDLCALC 182 (H) 12/04/2022 02:03 PM    Wt Readings from Last 3 Encounters:  04/01/23 212 lb (96.2 kg)  01/08/23 213 lb (96.6 kg)  12/04/22 216 lb (98 kg)     Objective:    Vital Signs:  BP (!) 160/88 (BP Location: Left Arm, Patient Position: Sitting, Cuff Size: Normal)   Pulse 78   Ht 5\' 6"  (1.676 m)   Wt 212 lb (96.2 kg)   BMI 34.22 kg/m      ASSESSMENT & PLAN:    Accelerated hypertension-she is hypertensive in the office today.  She tells me that she forgot to take her medication all weekend.  She notes that she is mostly compliant with her medicine when she packed them in the pill organizer which she has not done in a while.  She plans to restart doing that today. We talked about the sleep study she has not gotten this done she needs it because of snoring as well as daytime somnolence.  Will follow-up with this. No repeated palpitations for paroxysmal atrial tachycardia. Will repeat lipid profile prior to her next visit. Will see the patient in 6 weeks  COVID-19 Education: The signs and symptoms of COVID-19 were discussed with the patient and how to seek care for testing (follow up with PCP or arrange E-visit).  The importance of  social distancing was discussed today.  Time:   Today, I have spent 15 minutes with the patient with telehealth technology discussing the above problems.     Medication Adjustments/Labs and Tests Ordered: Current medicines are reviewed at length with the patient today.  Concerns regarding medicines are outlined above.   Tests Ordered: No orders of the defined types were placed in this encounter.   Medication Changes: No orders of the defined types were placed in this encounter.   Follow Up:  In Person in 6 week(s)  Signed, Thomasene Ripple, DO  04/01/2023 1:40 PM    [facility]

## 2023-04-01 NOTE — Patient Instructions (Signed)
Medication Instructions:  Your physician recommends that you continue on your current medications as directed. Please refer to the Current Medication list given to you today.  *If you need a refill on your cardiac medications before your next appointment, please call your pharmacy*   Lab Work: Your physician recommends that you return for lab work 2 days before your next appt: Lipids - please come fasting If you have labs (blood work) drawn today and your tests are completely normal, you will receive your results only by: MyChart Message (if you have MyChart) OR A paper copy in the mail If you have any lab test that is abnormal or we need to change your treatment, we will call you to review the results.   Testing/Procedures: None   Follow-Up: At [facility], you and your health needs are our priority.  As part of our continuing mission to provide you with exceptional heart care, we have created designated Provider Care Teams.  These Care Teams include your primary Cardiologist (physician) and Advanced Practice Providers (APPs -  Physician Assistants and Nurse Practitioners) who all work together to provide you with the care you need, when you need it.   Your next appointment:   6 week(s)  Provider:   Thomasene Ripple, DO

## 2023-04-01 NOTE — Addendum Note (Signed)
Addended by: Reynolds Bowl on: 04/01/2023 02:26 PM   Modules accepted: Orders

## 2023-04-01 NOTE — Telephone Encounter (Signed)
Called patient and left message to call back for her appointment and that we will call back again in a few minutes.

## 2023-05-13 DIAGNOSIS — E785 Hyperlipidemia, unspecified: Secondary | ICD-10-CM | POA: Diagnosis not present

## 2023-05-14 ENCOUNTER — Encounter: Payer: Self-pay | Admitting: Cardiology

## 2023-05-14 ENCOUNTER — Ambulatory Visit: Payer: BC Managed Care – PPO | Attending: Cardiology | Admitting: Cardiology

## 2023-05-14 VITALS — BP 118/82 | HR 73 | Ht 66.0 in | Wt 221.0 lb

## 2023-05-14 DIAGNOSIS — E785 Hyperlipidemia, unspecified: Secondary | ICD-10-CM

## 2023-05-14 DIAGNOSIS — I10 Essential (primary) hypertension: Secondary | ICD-10-CM

## 2023-05-14 MED ORDER — BLOOD PRESSURE CUFF MISC: 1.0000 [IU] | Freq: Once | 0 refills | Status: AC

## 2023-05-14 NOTE — Progress Notes (Signed)
Cardiology Office Note:    Date:  05/14/2023   ID:  Sheryl Garcia, DOB 01-28-1981, MRN [id number]  PCP:  Arnette Felts, FNP  Cardiologist:  Thomasene Ripple, DO  Electrophysiologist:  None   Referring MD: Arnette Felts, FNP   " I am doing well"  History of Present Illness:    Sheryl Garcia is a 42 y.o. female with a hx of paroxysmal supraventricular tachycardia, hypertension here today for follow-up visit.  At her last visit on April 01, 2023 blood pressure was elevated but she had not taking any of her medications.  She is here today for follow-up visit. She is doing well from a cardiovascular, no compliants today.   Past Medical History:  Diagnosis Date   GERD (gastroesophageal reflux disease)     No past surgical history on file.  Current Medications: Current Meds  Medication Sig   Blood Pressure Monitoring (BLOOD PRESSURE CUFF) MISC 1 Units by Does not apply route once for 1 dose.   Magnesium 200 MG TABS Take daily with evening meal (Patient taking differently: Take daily with evening meal. Pt taking 500mg  daily)   olmesartan (BENICAR) 20 MG tablet Take 1 tablet (20 mg total) by mouth daily.   rosuvastatin (CRESTOR) 10 MG tablet Take 1 tablet (10 mg total) by mouth daily.     Allergies:   Patient has no known allergies.   Social History   Socioeconomic History   Marital status: Significant Other    Spouse name: Not on file   Number of children: Not on file   Years of education: Not on file   Highest education level: Not on file  Occupational History   Not on file  Tobacco Use   Smoking status: Never   Smokeless tobacco: Never  Substance and Sexual Activity   Alcohol use: No   Drug use: No   Sexual activity: Yes  Other Topics Concern   Not on file  Social History Narrative   Not on file   Social Determinants of Health   Financial Resource Strain: Not on file  Food Insecurity: Not on file  Transportation Needs: Not on file  Physical Activity: Not on file   Stress: Not on file  Social Connections: Unknown (01/04/2022)   Received from [facility]   Social Network    Social Network: Not on file     Family History: The patient's family history is not on file.  ROS:   Review of Systems  Constitution: Negative for decreased appetite, fever and weight gain.  HENT: Negative for congestion, ear discharge, hoarse voice and sore throat.   Eyes: Negative for discharge, redness, vision loss in right eye and visual halos.  Cardiovascular: Negative for chest pain, dyspnea on exertion, leg swelling, orthopnea and palpitations.  Respiratory: Negative for cough, hemoptysis, shortness of breath and snoring.   Endocrine: Negative for heat intolerance and polyphagia.  Hematologic/Lymphatic: Negative for bleeding problem. Does not bruise/bleed easily.  Skin: Negative for flushing, nail changes, rash and suspicious lesions.  Musculoskeletal: Negative for arthritis, joint pain, muscle cramps, myalgias, neck pain and stiffness.  Gastrointestinal: Negative for abdominal pain, bowel incontinence, diarrhea and excessive appetite.  Genitourinary: Negative for decreased libido, genital sores and incomplete emptying.  Neurological: Negative for brief paralysis, focal weakness, headaches and loss of balance.  Psychiatric/Behavioral: Negative for altered mental status, depression and suicidal ideas.  Allergic/Immunologic: Negative for HIV exposure and persistent infections.    EKGs/Labs/Other Studies Reviewed:    The following  studies were reviewed today:   EKG:  The ekg ordered today demonstrates   Recent Labs: 08/29/2022: ALT 19; BUN 12; Creatinine, Ser 0.73; Hemoglobin 12.3; Platelets 357; Potassium 4.2; Sodium 138 12/04/2022: TSH 0.673  Recent Lipid Panel    Component Value Date/Time   CHOL 247 (H) 12/04/2022 1403   TRIG 48 12/04/2022 1403   HDL 58 12/04/2022 1403   CHOLHDL 4.3 12/04/2022 1403   LDLCALC 182 (H) 12/04/2022 1403     Physical Exam:    VS:  BP 118/82   Pulse 73   Ht 5\' 6"  (1.676 m)   Wt 221 lb (100.2 kg)   SpO2 98%   BMI 35.67 kg/m     Wt Readings from Last 3 Encounters:  05/14/23 221 lb (100.2 kg)  04/01/23 212 lb (96.2 kg)  01/08/23 213 lb (96.6 kg)     GEN: Well nourished, well developed in no acute distress HEENT: Normal NECK: No JVD; No carotid bruits LYMPHATICS: No lymphadenopathy CARDIAC: S1S2 noted,RRR, no murmurs, rubs, gallops RESPIRATORY:  Clear to auscultation without rales, wheezing or rhonchi  ABDOMEN: Soft, non-tender, non-distended, +bowel sounds, no guarding. EXTREMITIES: No edema, No cyanosis, no clubbing MUSCULOSKELETAL:  No deformity  SKIN: Warm and dry NEUROLOGIC:  Alert and oriented x 3, non-focal PSYCHIATRIC:  Normal affect, good insight  ASSESSMENT:    1. Chronic hypertension   2. Hyperlipidemia, unspecified hyperlipidemia type    PLAN:    Blood pressure is acceptable in the office today.  Continue the olmesartan 20 mg daily.  Will get blood pressure cuff prescription for the patient. Reviewed her lipid profile we will continue current dose of Crestor.  The patient is in agreement with the above plan. The patient left the office in stable condition.  The patient will follow up in 9 months or sooner if needed.   Medication Adjustments/Labs and Tests Ordered: Current medicines are reviewed at length with the patient today.  Concerns regarding medicines are outlined above.  No orders of the defined types were placed in this encounter.  Meds ordered this encounter  Medications   Blood Pressure Monitoring (BLOOD PRESSURE CUFF) MISC    Sig: 1 Units by Does not apply route once for 1 dose.    Dispense:  1 each    Refill:  0    Patient Instructions  Medication Instructions:  Your physician recommends that you continue on your current medications as directed. Please refer to the Current Medication list given to you today.    *If you need a refill on  your cardiac medications before your next appointment, please call your pharmacy*    Follow-Up: At [facility], you and your health needs are our priority.  As part of our continuing mission to provide you with exceptional heart care, we have created designated Provider Care Teams.  These Care Teams include your primary Cardiologist (physician) and Advanced Practice Providers (APPs -  Physician Assistants and Nurse Practitioners) who all work together to provide you with the care you need, when you need it.  We recommend signing up for the patient portal called "MyChart".  Sign up information is provided on this After Visit Summary.  MyChart is used to connect with patients for Virtual Visits (Telemedicine).  Patients are able to view lab/test results, encounter notes, upcoming appointments, etc.  Non-urgent messages can be sent to your provider as well.   To learn more about what you can do with MyChart, go to [email or web address].    Your next appointment:  9 month(s)  The format for your next appointment:   In Person  Provider:   Thomasene Ripple, DO    Other Instructions Dr. Servando Salina recommends you get a blood pressure cuff and check your blood pressure daily. Record the readings.    Adopting a Healthy Lifestyle.  Know what a healthy weight is for you (roughly BMI <25) and aim to maintain this   Aim for 7+ servings of fruits and vegetables daily   65-80+ fluid ounces of water or unsweet tea for healthy kidneys   Limit to max 1 drink of alcohol per day; avoid smoking/tobacco   Limit animal fats in diet for cholesterol and heart health - choose grass fed whenever available   Avoid highly processed foods, and foods high in saturated/trans fats   Aim for low stress - take time to unwind and care for your mental health   Aim for 150 min of moderate intensity exercise weekly for heart health, and weights twice weekly for bone health   Aim for 7-9 hours of sleep daily   When it  comes to diets, agreement about the perfect plan isnt easy to find, even among the experts. Experts at the [facility] of [employer] developed an idea known as the Healthy Eating Plate. Just imagine a plate divided into logical, healthy portions.   The emphasis is on diet quality:   Load up on vegetables and fruits - one-half of your plate: Aim for color and variety, and remember that potatoes dont count.   Go for whole grains - one-quarter of your plate: Whole wheat, barley, wheat berries, quinoa, oats, brown rice, and foods made with them. If you want pasta, go with whole wheat pasta.   Protein power - one-quarter of your plate: Fish, chicken, beans, and nuts are all healthy, versatile protein sources. Limit red meat.   The diet, however, does go beyond the plate, offering a few other suggestions.   Use healthy plant oils, such as olive, canola, soy, corn, sunflower and peanut. Check the labels, and avoid partially hydrogenated oil, which have unhealthy trans fats.   If youre thirsty, drink water. Coffee and tea are good in moderation, but skip sugary drinks and limit milk and dairy products to one or two daily servings.   The type of carbohydrate in the diet is more important than the amount. Some sources of carbohydrates, such as vegetables, fruits, whole grains, and beans-are healthier than others.   Finally, stay active  Signed, Thomasene Ripple, DO  05/14/2023 8:43 AM    [facility]

## 2023-05-14 NOTE — Patient Instructions (Signed)
Medication Instructions:  Your physician recommends that you continue on your current medications as directed. Please refer to the Current Medication list given to you today.    *If you need a refill on your cardiac medications before your next appointment, please call your pharmacy*    Follow-Up: At [facility], you and your health needs are our priority.  As part of our continuing mission to provide you with exceptional heart care, we have created designated Provider Care Teams.  These Care Teams include your primary Cardiologist (physician) and Advanced Practice Providers (APPs -  Physician Assistants and Nurse Practitioners) who all work together to provide you with the care you need, when you need it.  We recommend signing up for the patient portal called "MyChart".  Sign up information is provided on this After Visit Summary.  MyChart is used to connect with patients for Virtual Visits (Telemedicine).  Patients are able to view lab/test results, encounter notes, upcoming appointments, etc.  Non-urgent messages can be sent to your provider as well.   To learn more about what you can do with MyChart, go to [email or web address].    Your next appointment:   9 month(s)  The format for your next appointment:   In Person  Provider:   Thomasene Ripple, DO    Other Instructions Dr. Servando Salina recommends you get a blood pressure cuff and check your blood pressure daily. Record the readings.

## 2023-08-22 DIAGNOSIS — Z01419 Encounter for gynecological examination (general) (routine) without abnormal findings: Secondary | ICD-10-CM | POA: Diagnosis not present

## 2023-08-22 DIAGNOSIS — Z1231 Encounter for screening mammogram for malignant neoplasm of breast: Secondary | ICD-10-CM | POA: Diagnosis not present

## 2023-08-22 DIAGNOSIS — Z1151 Encounter for screening for human papillomavirus (HPV): Secondary | ICD-10-CM | POA: Diagnosis not present

## 2023-08-22 DIAGNOSIS — Z113 Encounter for screening for infections with a predominantly sexual mode of transmission: Secondary | ICD-10-CM | POA: Diagnosis not present

## 2023-08-22 DIAGNOSIS — Z124 Encounter for screening for malignant neoplasm of cervix: Secondary | ICD-10-CM | POA: Diagnosis not present

## 2023-08-22 DIAGNOSIS — Z130 Encounter for screening for diseases of the blood and blood-forming organs and certain disorders involving the immune mechanism: Secondary | ICD-10-CM | POA: Diagnosis not present

## 2023-09-03 ENCOUNTER — Encounter: Payer: Self-pay | Admitting: Nurse Practitioner

## 2023-09-03 ENCOUNTER — Ambulatory Visit: Payer: BC Managed Care – PPO | Admitting: Nurse Practitioner

## 2023-09-03 VITALS — BP 130/70 | HR 95 | Temp 98.5°F | Wt 235.4 lb

## 2023-09-03 DIAGNOSIS — I10 Essential (primary) hypertension: Secondary | ICD-10-CM | POA: Diagnosis not present

## 2023-09-03 DIAGNOSIS — E559 Vitamin D deficiency, unspecified: Secondary | ICD-10-CM

## 2023-09-03 DIAGNOSIS — R7303 Prediabetes: Secondary | ICD-10-CM | POA: Diagnosis not present

## 2023-09-03 DIAGNOSIS — Z1211 Encounter for screening for malignant neoplasm of colon: Secondary | ICD-10-CM | POA: Insufficient documentation

## 2023-09-03 DIAGNOSIS — Z79899 Other long term (current) drug therapy: Secondary | ICD-10-CM

## 2023-09-03 DIAGNOSIS — Z0000 Encounter for general adult medical examination without abnormal findings: Secondary | ICD-10-CM

## 2023-09-03 DIAGNOSIS — E66812 Obesity, class 2: Secondary | ICD-10-CM

## 2023-09-03 DIAGNOSIS — Z6837 Body mass index (BMI) 37.0-37.9, adult: Secondary | ICD-10-CM

## 2023-09-03 DIAGNOSIS — E6609 Other obesity due to excess calories: Secondary | ICD-10-CM

## 2023-09-03 DIAGNOSIS — Z2821 Immunization not carried out because of patient refusal: Secondary | ICD-10-CM | POA: Insufficient documentation

## 2023-09-03 LAB — POCT URINALYSIS DIP (CLINITEK)
Bilirubin, UA: NEGATIVE
Blood, UA: NEGATIVE
Glucose, UA: 100 mg/dL — AB
Ketones, POC UA: NEGATIVE mg/dL
Leukocytes, UA: NEGATIVE
Nitrite, UA: NEGATIVE
POC PROTEIN,UA: NEGATIVE
Spec Grav, UA: 1.02 (ref 1.010–1.025)
Urobilinogen, UA: 0.2 U/dL
pH, UA: 6.5 (ref 5.0–8.0)

## 2023-09-03 NOTE — Assessment & Plan Note (Signed)

## 2023-09-03 NOTE — Assessment & Plan Note (Signed)

## 2023-09-03 NOTE — Patient Instructions (Addendum)
 Goal to exercise 150 minutes per week with at least 2 days of strength training Encouraged to park further when at the store, take stairs instead of elevators and to walk in place during commercials. Increase water intake to at least one gallon of water daily. Focus on a healthy diet Work your way up with intermittent fasting - [email or web address].[email or web address] - Dr. Bronson - How to make intermittent fasting easier 2023 Take it one day at a time.

## 2023-09-03 NOTE — Assessment & Plan Note (Signed)
 Will check vitamin D level and supplement as needed.    Also encouraged to spend 15 minutes in the sun daily.

## 2023-09-03 NOTE — Assessment & Plan Note (Signed)

## 2023-09-03 NOTE — Assessment & Plan Note (Signed)
Hemoglobin A1c is stable.  She is no longer taking the metformin.

## 2023-09-03 NOTE — Assessment & Plan Note (Addendum)
Pending lab results will consider medications for weight loss.  She has had some weight gain and she is planning to get back on track.

## 2023-09-03 NOTE — Progress Notes (Signed)
 LILLETTE Kristeen JINNY Gladis, CMA,acting as a neurosurgeon for Sheryl Ada, FNP.,have documented all relevant documentation on the behalf of Sheryl Ada, FNP,as directed by  Sheryl Ada, FNP while in the presence of Sheryl Ada, FNP.  Subjective:    Patient ID: Gladis ONEIDA Garcia , female    DOB: 1981/05/23 , 42 y.o.   MRN: [id number]  Chief Complaint  Patient presents with   Annual Exam    HPI  Patient presents today for HM, Patient reports compliance with medication. Patient denies any chest pain, SOB, or headaches. Patient has no concerns today. She is followed at [facility] for her womens care she had her mammogram and PAP last week.     Past Medical History:  Diagnosis Date   GERD (gastroesophageal reflux disease)    Hyperlipidemia      Family History  Problem Relation Age of Onset   Heart disease Maternal Grandmother    Hypertension Maternal Grandmother      Current Outpatient Medications:    Magnesium  200 MG TABS, Take daily with evening meal (Patient taking differently: Take daily with evening meal. Pt taking 500mg  daily), Disp: 30 tablet, Rfl: 3   olmesartan  (BENICAR ) 20 MG tablet, Take 1 tablet (20 mg total) by mouth daily., Disp: 90 tablet, Rfl: 1   rosuvastatin  (CRESTOR ) 10 MG tablet, Take 1 tablet (10 mg total) by mouth daily., Disp: 90 tablet, Rfl: 3   No Known Allergies    The patient states she uses tubal ligation for birth control. No LMP recorded. (Menstrual status: Irregular Periods).  Negative for Dysmenorrhea and Negative for Menorrhagia. Negative for: breast discharge, breast lump(s), breast pain and breast self exam. Associated symptoms include abnormal vaginal bleeding. Pertinent negatives include abnormal bleeding (hematology), anxiety, decreased libido, depression, difficulty falling sleep, dyspareunia, history of infertility, nocturia, sexual dysfunction, sleep disturbances, urinary incontinence, urinary urgency, vaginal discharge and vaginal itching. Diet  regular; she is getting back on track, she is getting refocused due to stress. The patient states her exercise level is moderate - continues with AWOL.   The patient's tobacco use is:   She has been exposed to passive smoke. The patient's alcohol use is:  Social History   Substance and Sexual Activity  Alcohol Use No   Additional information: Last pap 05/31/2021, next one scheduled for 05/31/2026.    Review of Systems  Constitutional: Negative.   HENT: Negative.    Eyes: Negative.   Respiratory: Negative.    Cardiovascular: Negative.   Gastrointestinal: Negative.   Endocrine: Negative.   Genitourinary: Negative.   Musculoskeletal: Negative.   Skin: Negative.   Allergic/Immunologic: Negative.   Neurological: Negative.   Hematological: Negative.   Psychiatric/Behavioral: Negative.       Today's Vitals   09/03/23 0858  BP: 130/70  Pulse: 95  Temp: 98.5 F (36.9 C)  TempSrc: Oral  Weight: 235 lb 6.4 oz (106.8 kg)  PainSc: 0-No pain   Body mass index is 37.99 kg/m.  Wt Readings from Last 3 Encounters:  09/03/23 235 lb 6.4 oz (106.8 kg)  05/14/23 221 lb (100.2 kg)  04/01/23 212 lb (96.2 kg)     Objective:  Physical Exam Vitals reviewed.  Constitutional:      General: She is not in acute distress.    Appearance: Normal appearance. She is well-developed. She is obese.  HENT:     Head: Normocephalic and atraumatic.     Right Ear: Hearing, tympanic membrane, ear canal and external ear normal. There is no impacted cerumen.  Left Ear: Hearing, tympanic membrane, ear canal and external ear normal. There is no impacted cerumen.     Nose: Nose normal.     Mouth/Throat:     Mouth: Mucous membranes are moist.  Eyes:     General: Lids are normal.     Extraocular Movements: Extraocular movements intact.     Conjunctiva/sclera: Conjunctivae normal.     Pupils: Pupils are equal, round, and reactive to light.     Funduscopic exam:    Right eye: No papilledema.         Left eye: No papilledema.  Neck:     Thyroid: No thyroid mass.     Vascular: No carotid bruit.  Cardiovascular:     Rate and Rhythm: Normal rate and regular rhythm.     Pulses: Normal pulses.     Heart sounds: Normal heart sounds. No murmur heard. Pulmonary:     Effort: Pulmonary effort is normal. No respiratory distress.     Breath sounds: Normal breath sounds. No wheezing.  Chest:     Chest wall: No mass.  Breasts:    Tanner Score is 5.     Right: Normal. No mass or tenderness.     Left: Normal. No mass or tenderness.  Abdominal:     General: Abdomen is flat. Bowel sounds are normal. There is no distension.     Palpations: Abdomen is soft.     Tenderness: There is no abdominal tenderness.  Musculoskeletal:        General: No swelling. Normal range of motion.     Cervical back: Full passive range of motion without pain, normal range of motion and neck supple.     Right lower leg: No edema.     Left lower leg: No edema.  Lymphadenopathy:     Upper Body:     Right upper body: No supraclavicular, axillary or pectoral adenopathy.     Left upper body: No supraclavicular, axillary or pectoral adenopathy.  Skin:    General: Skin is warm and dry.     Capillary Refill: Capillary refill takes less than 2 seconds.  Neurological:     General: No focal deficit present.     Mental Status: She is alert and oriented to person, place, and time.     Cranial Nerves: No cranial nerve deficit.     Sensory: No sensory deficit.     Motor: No weakness.  Psychiatric:        Mood and Affect: Mood normal.        Behavior: Behavior normal.        Thought Content: Thought content normal.        Judgment: Judgment normal.      Assessment And Plan:     Encounter for annual health examination Assessment & Plan: Behavior modifications discussed and diet history reviewed.   Pt will continue to exercise regularly and modify diet with low GI, plant based foods and decrease intake of processed foods.   Recommend intake of daily multivitamin, Vitamin D , and calcium .  Recommend mammogram and colonoscopy for preventive screenings, as well as recommend immunizations that include influenza, TDAP    Essential hypertension Assessment & Plan: Hemoglobin A1c is stable.  She is no longer taking the metformin .    Orders: -     POCT URINALYSIS DIP (CLINITEK) -     Microalbumin / creatinine urine ratio -     CMP14+EGFR -     EKG 12-Lead  Prediabetes Assessment & Plan:  Hemoglobin A1c has been stable.  Encouraged to focus on diet low in sugar and carbs.  Orders: -     Hemoglobin A1c -     Lipid panel  Vitamin D  deficiency Assessment & Plan: Will check vitamin D  level and supplement as needed.    Also encouraged to spend 15 minutes in the sun daily.    Orders: -     VITAMIN D  25 Hydroxy (Vit-D Deficiency, Fractures)  Class 2 obesity due to excess calories with body mass index (BMI) of 37.0 to 37.9 in adult, unspecified whether serious comorbidity present Assessment & Plan: Pending lab results will consider medications for weight loss.  She has had some weight gain and she is planning to get back on track.   COVID-19 vaccination declined Assessment & Plan: Declines covid 19 vaccine. Discussed risk of covid 47 and if she changes her mind about the vaccine to call the office. Education has been provided regarding the importance of this vaccine but patient still declined. Advised may receive this vaccine at local pharmacy or Health Dept.or vaccine clinic. Aware to provide a copy of the vaccination record if obtained from local pharmacy or Health Dept.  Encouraged to take multivitamin, vitamin d , vitamin c and zinc to increase immune system. Aware can call office if would like to have vaccine here at office. Verbalized acceptance and understanding.    Influenza vaccination declined Assessment & Plan: Patient declined influenza vaccination at this time. Patient is aware that influenza  vaccine prevents illness in 70% of healthy people, and reduces hospitalizations to 30-70% in elderly. This vaccine is recommended annually. Education has been provided regarding the importance of this vaccine but patient still declined. Advised may receive this vaccine at local pharmacy or Health Dept.or vaccine clinic. Aware to provide a copy of the vaccination record if obtained from local pharmacy or Health Dept.  Pt is willing to accept risk associated with refusing vaccination.    Other long term (current) drug therapy -     CBC with Differential/Platelet     Return for 1 year physical, 6 month bp check. Patient was given opportunity to ask questions. Patient verbalized understanding of the plan and was able to repeat key elements of the plan. All questions were answered to their satisfaction.   Sheryl Ada, FNP  I, Sheryl Ada, FNP, have reviewed all documentation for this visit. The documentation on 09/03/23 for the exam, diagnosis, procedures, and orders are all accurate and complete.

## 2023-09-03 NOTE — Assessment & Plan Note (Signed)
Hemoglobin A1c has been stable.  Encouraged to focus on diet low in sugar and carbs.

## 2023-09-05 LAB — CBC WITH DIFFERENTIAL/PLATELET
Basophils Absolute: 0 10*3/uL (ref 0.0–0.2)
Basos: 1 %
EOS (ABSOLUTE): 0.1 10*3/uL (ref 0.0–0.4)
Eos: 1 %
Hematocrit: 42.5 % (ref 34.0–46.6)
Hemoglobin: 13.4 g/dL (ref 11.1–15.9)
Immature Grans (Abs): 0 10*3/uL (ref 0.0–0.1)
Immature Granulocytes: 0 %
Lymphocytes Absolute: 2.3 10*3/uL (ref 0.7–3.1)
Lymphs: 33 %
MCH: 28 pg (ref 26.6–33.0)
MCHC: 31.5 g/dL (ref 31.5–35.7)
MCV: 89 fL (ref 79–97)
Monocytes Absolute: 0.5 10*3/uL (ref 0.1–0.9)
Monocytes: 7 %
Neutrophils Absolute: 4.2 10*3/uL (ref 1.4–7.0)
Neutrophils: 58 %
Platelets: 332 10*3/uL (ref 150–450)
RBC: 4.78 x10E6/uL (ref 3.77–5.28)
RDW: 13.1 % (ref 11.7–15.4)
WBC: 7.1 10*3/uL (ref 3.4–10.8)

## 2023-09-05 LAB — LIPID PANEL
Chol/HDL Ratio: 2.6 {ratio} (ref 0.0–4.4)
Cholesterol, Total: 177 mg/dL (ref 100–199)
HDL: 67 mg/dL (ref >39)
LDL Chol Calc (NIH): 100 mg/dL — ABNORMAL HIGH (ref 0–99)
Triglycerides: 50 mg/dL (ref 0–149)
VLDL Cholesterol Cal: 10 mg/dL (ref 5–40)

## 2023-09-05 LAB — MICROALBUMIN / CREATININE URINE RATIO
Creatinine, Urine: 73 mg/dL
Microalb/Creat Ratio: <4 mg/g{creat} (ref 0–29)
Microalbumin, Urine: <3 ug/mL

## 2023-09-05 LAB — CMP14+EGFR
ALT: 19 [IU]/L (ref 0–32)
AST: 22 [IU]/L (ref 0–40)
Albumin: 4.3 g/dL (ref 3.9–4.9)
Alkaline Phosphatase: 78 [IU]/L (ref 44–121)
BUN/Creatinine Ratio: 26 — ABNORMAL HIGH (ref 9–23)
BUN: 17 mg/dL (ref 6–24)
Bilirubin Total: 0.3 mg/dL (ref 0.0–1.2)
CO2: 19 mmol/L — ABNORMAL LOW (ref 20–29)
Calcium: 10.2 mg/dL (ref 8.7–10.2)
Chloride: 103 mmol/L (ref 96–106)
Creatinine, Ser: 0.65 mg/dL (ref 0.57–1.00)
Globulin, Total: 3 g/dL (ref 1.5–4.5)
Glucose: 68 mg/dL — ABNORMAL LOW (ref 70–99)
Potassium: 4.6 mmol/L (ref 3.5–5.2)
Sodium: 137 mmol/L (ref 134–144)
Total Protein: 7.3 g/dL (ref 6.0–8.5)
eGFR: 111 mL/min/{1.73_m2} (ref >59)

## 2023-09-05 LAB — HEMOGLOBIN A1C
Est. average glucose Bld gHb Est-mCnc: 111 mg/dL
Hgb A1c MFr Bld: 5.5 % (ref 4.8–5.6)

## 2023-09-05 LAB — VITAMIN D 25 HYDROXY (VIT D DEFICIENCY, FRACTURES): Vit D, 25-Hydroxy: 31.9 ng/mL (ref 30.0–100.0)

## 2023-09-11 ENCOUNTER — Encounter: Payer: Self-pay | Admitting: Nurse Practitioner

## 2023-09-13 ENCOUNTER — Other Ambulatory Visit: Payer: Self-pay | Admitting: Nurse Practitioner

## 2023-09-13 ENCOUNTER — Other Ambulatory Visit (HOSPITAL_COMMUNITY): Payer: Self-pay

## 2023-09-13 DIAGNOSIS — I10 Essential (primary) hypertension: Secondary | ICD-10-CM

## 2023-09-13 DIAGNOSIS — R7303 Prediabetes: Secondary | ICD-10-CM

## 2023-09-13 MED ORDER — WEGOVY 0.25 MG/0.5ML ~~LOC~~ SOAJ
0.2500 mg | SUBCUTANEOUS | 0 refills | Status: DC
  Filled 2023-09-13: qty 2, 28d supply, fill #0

## 2023-09-25 ENCOUNTER — Other Ambulatory Visit (HOSPITAL_COMMUNITY): Payer: Self-pay

## 2023-10-27 ENCOUNTER — Other Ambulatory Visit: Payer: Self-pay | Admitting: Nurse Practitioner

## 2023-10-27 DIAGNOSIS — I10 Essential (primary) hypertension: Secondary | ICD-10-CM

## 2024-02-06 ENCOUNTER — Encounter: Payer: Self-pay | Admitting: Cardiology

## 2024-03-02 ENCOUNTER — Ambulatory Visit: Payer: BC Managed Care – PPO | Admitting: Nurse Practitioner

## 2024-03-02 NOTE — Progress Notes (Deleted)
 Garcia Garcia, CMA,acting as a Neurosurgeon for Sheryl Ada, FNP.,have documented all relevant documentation on the behalf of Sheryl Ada, FNP,as directed by  Sheryl Ada, FNP while in the presence of Sheryl Ada, FNP.  Subjective:  Patient ID: Garcia Garcia , female    DOB: 14-Feb-1981 , 43 y.o.   MRN: [id number]  No chief complaint on file.   HPI  HPI   Past Medical History:  Diagnosis Date   GERD (gastroesophageal reflux disease)    Hyperlipidemia      Family History  Problem Relation Age of Onset   Heart disease Maternal Grandmother    Hypertension Maternal Grandmother      Current Outpatient Medications:    Magnesium  200 MG TABS, Take daily with evening meal (Patient taking differently: Take daily with evening meal. Pt taking 500mg  daily), Disp: 30 tablet, Rfl: 3   olmesartan  (BENICAR ) 20 MG tablet, TAKE 1 TABLET BY MOUTH EVERY DAY, Disp: 90 tablet, Rfl: 1   rosuvastatin  (CRESTOR ) 10 MG tablet, Take 1 tablet (10 mg total) by mouth daily., Disp: 90 tablet, Rfl: 3   Semaglutide -Weight Management (WEGOVY ) 0.25 MG/0.5ML SOAJ, Inject 0.25 mg into the skin once a week., Disp: 2 mL, Rfl: 0   No Known Allergies   Review of Systems   There were no vitals filed for this visit. There is no height or weight on file to calculate BMI.  Wt Readings from Last 3 Encounters:  09/03/23 235 lb 6.4 oz (106.8 kg)  05/14/23 221 lb (100.2 kg)  04/01/23 212 lb (96.2 kg)    The 10-year ASCVD risk score (Arnett DK, et al., 2019) is: 1.5%   Values used to calculate the score:     Age: 6 years     Clincally relevant sex: Female     Is Non-Hispanic African American: Yes     Diabetic: No     Tobacco smoker: No     Systolic Blood Pressure: 130 mmHg     Is BP treated: Yes     HDL Cholesterol: 67 mg/dL     Total Cholesterol: 177 mg/dL  Objective:  Physical Exam      Assessment And Plan:  Essential hypertension  Prediabetes    No follow-ups on file.  Patient was given opportunity  to ask questions. Patient verbalized understanding of the plan and was able to repeat key elements of the plan. All questions were answered to their satisfaction.    Garcia Sheryl Ada, FNP, have reviewed all documentation for this visit. The documentation on 03/02/24 for the exam, diagnosis, procedures, and orders are all accurate and complete.   IF YOU HAVE BEEN REFERRED TO A SPECIALIST, IT MAY TAKE 1-2 WEEKS TO SCHEDULE/PROCESS THE REFERRAL. IF YOU HAVE NOT HEARD FROM US /SPECIALIST IN TWO WEEKS, PLEASE GIVE US  A CALL AT [phone].

## 2024-03-04 ENCOUNTER — Other Ambulatory Visit (HOSPITAL_COMMUNITY): Payer: Self-pay

## 2024-03-10 ENCOUNTER — Encounter: Payer: Self-pay | Admitting: Cardiology

## 2024-03-10 MED ORDER — ROSUVASTATIN CALCIUM 10 MG PO TABS: 10.0000 mg | ORAL_TABLET | Freq: Every day | ORAL | 0 refills | Status: DC

## 2024-05-15 ENCOUNTER — Other Ambulatory Visit: Payer: Self-pay | Admitting: Cardiology

## 2024-05-27 ENCOUNTER — Encounter: Payer: Self-pay | Admitting: *Deleted

## 2024-05-28 ENCOUNTER — Ambulatory Visit: Attending: Cardiology | Admitting: Cardiology

## 2024-08-05 ENCOUNTER — Other Ambulatory Visit: Payer: Self-pay | Admitting: Nurse Practitioner

## 2024-08-05 DIAGNOSIS — I10 Essential (primary) hypertension: Secondary | ICD-10-CM

## 2024-08-17 ENCOUNTER — Encounter: Payer: Self-pay | Admitting: Cardiology

## 2024-08-18 MED ORDER — ROSUVASTATIN CALCIUM 10 MG PO TABS: 10.0000 mg | ORAL_TABLET | Freq: Every day | ORAL | 0 refills | Status: DC

## 2024-09-07 ENCOUNTER — Encounter: Payer: Self-pay | Admitting: Nurse Practitioner

## 2024-09-07 ENCOUNTER — Telehealth: Payer: Self-pay

## 2024-09-07 NOTE — Telephone Encounter (Signed)
 Called 09/07/24 @7 :26am to resch appt no answer left message on voicemail Gaines is out sick

## 2024-09-08 ENCOUNTER — Ambulatory Visit (INDEPENDENT_AMBULATORY_CARE_PROVIDER_SITE_OTHER): Admitting: Nurse Practitioner

## 2024-09-08 ENCOUNTER — Encounter: Payer: Self-pay | Admitting: Nurse Practitioner

## 2024-09-08 VITALS — BP 122/80 | HR 69 | Temp 98.1°F | Ht 66.0 in | Wt 282.0 lb

## 2024-09-08 DIAGNOSIS — Z6841 Body Mass Index (BMI) 40.0 and over, adult: Secondary | ICD-10-CM | POA: Diagnosis not present

## 2024-09-08 DIAGNOSIS — Z1211 Encounter for screening for malignant neoplasm of colon: Secondary | ICD-10-CM | POA: Diagnosis not present

## 2024-09-08 DIAGNOSIS — Z0000 Encounter for general adult medical examination without abnormal findings: Secondary | ICD-10-CM

## 2024-09-08 DIAGNOSIS — Z2821 Immunization not carried out because of patient refusal: Secondary | ICD-10-CM

## 2024-09-08 DIAGNOSIS — R7303 Prediabetes: Secondary | ICD-10-CM | POA: Diagnosis not present

## 2024-09-08 DIAGNOSIS — E559 Vitamin D deficiency, unspecified: Secondary | ICD-10-CM | POA: Diagnosis not present

## 2024-09-08 DIAGNOSIS — I10 Essential (primary) hypertension: Secondary | ICD-10-CM | POA: Diagnosis not present

## 2024-09-08 DIAGNOSIS — Z79899 Other long term (current) drug therapy: Secondary | ICD-10-CM | POA: Diagnosis not present

## 2024-09-08 DIAGNOSIS — E782 Mixed hyperlipidemia: Secondary | ICD-10-CM | POA: Diagnosis not present

## 2024-09-08 LAB — POCT URINALYSIS DIP (CLINITEK)
Bilirubin, UA: NEGATIVE
Blood, UA: NEGATIVE
Glucose, UA: NEGATIVE mg/dL
Ketones, POC UA: NEGATIVE mg/dL
Leukocytes, UA: NEGATIVE
Nitrite, UA: NEGATIVE
POC PROTEIN,UA: NEGATIVE
Spec Grav, UA: <=1.005 — AB
Urobilinogen, UA: 0.2 U/dL
pH, UA: 6

## 2024-09-08 NOTE — Assessment & Plan Note (Signed)
 Recent weight gain may impact blood pressure control. - Continue current antihypertensive medication regimen.

## 2024-09-08 NOTE — Assessment & Plan Note (Addendum)
 Hemoglobin A1c has been stable.  Encouraged to focus on diet low in sugar and carbs. I am concerned with her recent weight gain that her A1c may be elevated.

## 2024-09-08 NOTE — Patient Instructions (Addendum)
 Health Maintenance, Female Adopting a healthy lifestyle and getting preventive care are important in promoting health and wellness. Ask your health care provider about: The right schedule for you to have regular tests and exams. Things you can do on your own to prevent diseases and keep yourself healthy. What should I know about diet, weight, and exercise? Eat a healthy diet  Eat a diet that includes plenty of vegetables, fruits, low-fat dairy products, and lean protein. Do not eat a lot of foods that are high in solid fats, added sugars, or sodium. Maintain a healthy weight Body mass index (BMI) is used to identify weight problems. It estimates body fat based on height and weight. Your health care provider can help determine your BMI and help you achieve or maintain a healthy weight. Get regular exercise Get regular exercise. This is one of the most important things you can do for your health. Most adults should: Exercise for at least 150 minutes each week. The exercise should increase your heart rate and make you sweat (moderate-intensity exercise). Do strengthening exercises at least twice a week. This is in addition to the moderate-intensity exercise. Spend less time sitting. Even light physical activity can be beneficial. Watch cholesterol and blood lipids Have your blood tested for lipids and cholesterol at 46 years of age, then have this test every 5 years. Have your cholesterol levels checked more often if: Your lipid or cholesterol levels are high. You are older than 46 years of age. You are at high risk for heart disease. What should I know about cancer screening? Depending on your health history and family history, you may need to have cancer screening at various ages. This may include screening for: Breast cancer. Cervical cancer. Colorectal cancer. Skin cancer. Lung cancer. What should I know about heart disease, diabetes, and high blood pressure? Blood pressure and heart  disease High blood pressure causes heart disease and increases the risk of stroke. This is more likely to develop in people who have high blood pressure readings or are overweight. Have your blood pressure checked: Every 3-5 years if you are 30-29 years of age. Every year if you are 7 years old or older. Diabetes Have regular diabetes screenings. This checks your fasting blood sugar level. Have the screening done: Once every three years after age 66 if you are at a normal weight and have a low risk for diabetes. More often and at a younger age if you are overweight or have a high risk for diabetes. What should I know about preventing infection? Hepatitis B If you have a higher risk for hepatitis B, you should be screened for this virus. Talk with your health care provider to find out if you are at risk for hepatitis B infection. Hepatitis C Testing is recommended for: Everyone born from [birth year] through 1967. Anyone with known risk factors for hepatitis C. Sexually transmitted infections (STIs) Get screened for STIs, including gonorrhea and chlamydia, if: You are sexually active and are younger than 46 years of age. You are older than 46 years of age and your health care provider tells you that you are at risk for this type of infection. Your sexual activity has changed since you were last screened, and you are at increased risk for chlamydia or gonorrhea. Ask your health care provider if you are at risk. Ask your health care provider about whether you are at high risk for HIV. Your health care provider may recommend a prescription medicine to help prevent HIV  infection. If you choose to take medicine to prevent HIV, you should first get tested for HIV. You should then be tested every 3 months for as long as you are taking the medicine. Pregnancy If you are about to stop having your period (premenopausal) and you may become pregnant, seek counseling before you get pregnant. Take 400 to 800  micrograms (mcg) of folic acid every day if you become pregnant. Ask for birth control (contraception) if you want to prevent pregnancy. Osteoporosis and menopause Osteoporosis is a disease in which the bones lose minerals and strength with aging. This can result in bone fractures. If you are 3 years old or older, or if you are at risk for osteoporosis and fractures, ask your health care provider if you should: Be screened for bone loss. Take a calcium  or vitamin D  supplement to lower your risk of fractures. Be given hormone replacement therapy (HRT) to treat symptoms of menopause. Follow these instructions at home: Alcohol use Do not drink alcohol if: Your health care provider tells you not to drink. You are pregnant, may be pregnant, or are planning to become pregnant. If you drink alcohol: Limit how much you have to: 0-1 drink a day. Know how much alcohol is in your drink. In the [place] one drink equals one 12 oz bottle of beer (355 mL), one 5 oz glass of wine (148 mL), or one 1 oz glass of hard liquor (44 mL). Lifestyle Do not use any products that contain nicotine or tobacco. These products include cigarettes, chewing tobacco, and vaping devices, such as e-cigarettes. If you need help quitting, ask your health care provider. Do not use street drugs. Do not share needles. Ask your health care provider for help if you need support or information about quitting drugs. General instructions Schedule regular health, dental, and eye exams. Stay current with your vaccines. Tell your health care provider if: You often feel depressed. You have ever been abused or do not feel safe at home. Summary Adopting a healthy lifestyle and getting preventive care are important in promoting health and wellness. Follow your health care provider's instructions about healthy diet, exercising, and getting tested or screened for diseases. Follow your health care provider's instructions on monitoring your  cholesterol and blood pressure. This information is not intended to replace advice given to you by your health care provider. Make sure you discuss any questions you have with your health care provider. Document Revised: 01/09/2021 Document Reviewed: 01/09/2021 Elsevier Patient Education  2024 [employer].    VISIT SUMMARY: You came in today for your annual physical exam. We discussed your recent weight gain, your diet, and your overall health. We also talked about your blood pressure and general health maintenance, including the need for a colonoscopy and updating your Pap smear results.  YOUR PLAN: -CLASS 3 OBESITY DUE TO EXCESS CALORIES: Class 3 obesity means having a very high body mass index (BMI) due to consuming more calories than your body needs. We discussed resuming intermittent fasting and regular exercise, aiming for five days a week. We will also check the availability of a new oral medication, Wegovy , in a couple of weeks. Please follow up in 8-10 weeks to monitor your progress and discuss potential medication options.  -ESSENTIAL HYPERTENSION: Essential hypertension is high blood pressure with no identifiable cause. Your recent weight gain may affect your blood pressure control. Please continue your current blood pressure medication regimen.  -GENERAL HEALTH MAINTENANCE: You are due for a colonoscopy now that  you are 44 years old. We have referred you to a gastroenterologist for this procedure. We also need to update your Pap smear results from 2024 in your chart. Additionally, we discussed the importance of getting a flu vaccination due to high flu numbers this season.  INSTRUCTIONS: Please follow up in 8-10 weeks to monitor your progress with weight management and discuss potential medication options. Schedule a colonoscopy with a gastroenterologist and ensure your Pap smear results from 2024 are updated in your chart. Consider getting a flu vaccination soon.    Contains text  generated by [employer].

## 2024-09-08 NOTE — Assessment & Plan Note (Signed)
 Significant weight gain of 50 pounds over the past year. Previous success with intermittent fasting and exercise. Discussed potential new oral agent, Wegovy , which may be available soon and is cash pay at $140-$150 with a coupon. - Encouraged resumption of intermittent fasting and regular exercise, aiming for five days a week. - Will check availability of new oral agent, Wegovy , in a couple of weeks. - Scheduled follow-up in 8-10 weeks to monitor progress and discuss potential medication options.

## 2024-09-08 NOTE — Assessment & Plan Note (Signed)
 Will check vitamin D  level and supplement as needed.    Also encouraged to spend 15 minutes in the sun daily.

## 2024-09-08 NOTE — Assessment & Plan Note (Signed)
 Cholesterol levels were better with last check, continue low fat diet.

## 2024-09-08 NOTE — Progress Notes (Signed)
 Sheryl Garcia, CMA,acting as a neurosurgeon for [employer], FNP.,have documented all relevant documentation on the behalf of Sheryl Ada, FNP,as directed by  Sheryl Ada, FNP while in the presence of Sheryl Ada, FNP.  Subjective:    Patient ID: Sheryl Garcia , female    DOB: 01/29/1981 , 44 y.o.   MRN: [id number]  Chief Complaint  Patient presents with   Annual Exam    Patient presents today for HM, Patient reports compliance with medication. Patient denies any chest pain, SOB, or headaches. Patient has no concerns today. She is followed at [facility] for her womens care.    Continues to see Dr. Sheena she is due to see her this month. Her GYN is Annabelle Key and is due to her this month for her mammogram at her office.     Discussed the use of AI scribe software for clinical note transcription with the patient, who gave verbal consent to proceed.  History of Present Illness Sheryl Garcia Sheryl Garcia is a 44 year old female who presents for an annual physical exam.  She has experienced significant weight gain of about 50 pounds over the past year. She previously tried Wegovy  for weight management, but insurance did not cover it, and she could not continue due to cost constraints. Her diet has been poor over the past year, particularly in the last six months, and she is attempting to reset her eating habits.  She drinks about 120 ounces of water daily and experiences occasional constipation, typically going two days without a bowel movement, which she manages with over-the-counter remedies. No diarrhea.  She performs monthly self-breast exams and has had regular Pap smears, with the last one in 2024. Her menstrual cycle is regular, and she has not experienced any issues. She underwent the Essure procedure for permanent birth control several years ago.  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 She has two new grandchildren.  Past Medical History:  Diagnosis Date    GERD (gastroesophageal reflux disease)    Hyperlipidemia      Family History  Problem Relation Age of Onset   Heart disease Maternal Grandmother    Hypertension Maternal Grandmother     Current Medications[1]   Allergies[2]    The patient states she uses eSure procedure for permanent birth control for birth control. Patient's last menstrual period was 08/07/2024.. Negative for Dysmenorrhea and Negative for Menorrhagia. Negative for: breast discharge, breast lump(s), breast pain and breast self exam. Associated symptoms include abnormal vaginal bleeding. Pertinent negatives include abnormal bleeding (hematology), anxiety, decreased libido, depression, difficulty falling sleep, dyspareunia, history of infertility, nocturia, sexual dysfunction, sleep disturbances, urinary incontinence, urinary urgency, vaginal discharge and vaginal itching. Diet regular; admits to eating bad for the past year, fell off about 6 months ago. Plans to reset. She plans to start back intermittent fasting. The patient states her exercise level is none over the last 6 months to a year. Planning to exercise 5 days a week.   The patient's tobacco use is: Tobacco Use History[3]. She has been exposed to passive smoke. The patient's alcohol use is:  Social History   Substance and Sexual Activity  Alcohol Use No   Additional information: Last pap 08/2023 - no results in chart, next one scheduled for this month.    Review of Systems  Constitutional: Negative.   HENT: Negative.    Eyes: Negative.   Respiratory: Negative.    Cardiovascular: Negative.   Gastrointestinal: Negative.  Endocrine: Negative.   Genitourinary: Negative.   Musculoskeletal: Negative.   Skin: Negative.   Allergic/Immunologic: Negative.   Neurological: Negative.   Hematological: Negative.   Psychiatric/Behavioral: Negative.       Today's Vitals   09/08/24 0831  BP: 122/80  Pulse: 69  Temp: 98.1 F (36.7 C)  Weight: 282 lb (127.9  kg)  Height: 5' 6 (1.676 m)  PainSc: 0-No pain   Body mass index is 45.52 kg/m.  Wt Readings from Last 3 Encounters:  09/08/24 282 lb (127.9 kg)  09/03/23 235 lb 6.4 oz (106.8 kg)  05/14/23 221 lb (100.2 kg)     Objective:  Physical Exam Vitals and nursing note reviewed.  Constitutional:      General: She is not in acute distress.    Appearance: Normal appearance. She is well-developed. She is obese.  HENT:     Head: Normocephalic and atraumatic.     Right Ear: Hearing, tympanic membrane, ear canal and external ear normal. There is no impacted cerumen.     Left Ear: Hearing, tympanic membrane, ear canal and external ear normal. There is no impacted cerumen.     Nose: Nose normal.     Mouth/Throat:     Mouth: Mucous membranes are moist.  Eyes:     General: Lids are normal.     Extraocular Movements: Extraocular movements intact.     Conjunctiva/sclera: Conjunctivae normal.     Pupils: Pupils are equal, round, and reactive to light.     Funduscopic exam:    Right eye: No papilledema.        Left eye: No papilledema.  Neck:     Thyroid: No thyroid mass.     Vascular: No carotid bruit.  Cardiovascular:     Rate and Rhythm: Normal rate and regular rhythm.     Pulses: Normal pulses.     Heart sounds: Normal heart sounds. No murmur heard. Pulmonary:     Effort: Pulmonary effort is normal. No respiratory distress.     Breath sounds: Normal breath sounds. No wheezing.  Chest:     Chest wall: No mass.  Breasts:    Tanner Score is 5.     Right: Normal. No mass or tenderness.     Left: Normal. No mass or tenderness.  Abdominal:     General: Abdomen is flat. Bowel sounds are normal. There is no distension.     Palpations: Abdomen is soft.     Tenderness: There is no abdominal tenderness.  Musculoskeletal:        General: No swelling. Normal range of motion.     Cervical back: Full passive range of motion without pain, normal range of motion and neck supple.     Right lower  leg: No edema.     Left lower leg: No edema.  Lymphadenopathy:     Upper Body:     Right upper body: No supraclavicular, axillary or pectoral adenopathy.     Left upper body: No supraclavicular, axillary or pectoral adenopathy.  Skin:    General: Skin is warm and dry.     Capillary Refill: Capillary refill takes less than 2 seconds.  Neurological:     General: No focal deficit present.     Mental Status: She is alert and oriented to person, place, and time.     Cranial Nerves: No cranial nerve deficit.     Sensory: No sensory deficit.     Motor: No weakness.  Psychiatric:  Mood and Affect: Mood normal.        Behavior: Behavior normal.        Thought Content: Thought content normal.        Judgment: Judgment normal.      Assessment And Plan:     Encounter for annual health examination Assessment & Plan: According to USPTF Colorectal cancer Screening guidelines. Colonoscopy is recommended every 10 years, starting at age 9 years. Will refer to GI for colon cancer screening.    Essential hypertension Assessment & Plan: Recent weight gain may impact blood pressure control. - Continue current antihypertensive medication regimen.  Orders: -     POCT URINALYSIS DIP (CLINITEK) -     Microalbumin / creatinine urine ratio -     EKG 12-Lead -     CMP14+EGFR  Prediabetes Assessment & Plan: Hemoglobin A1c has been stable.  Encouraged to focus on diet low in sugar and carbs. I am concerned with her recent weight gain that her A1c may be elevated.   Orders: -     Hemoglobin A1c  Vitamin D  deficiency Assessment & Plan: Will check vitamin D  level and supplement as needed.    Also encouraged to spend 15 minutes in the sun daily.     Mixed hyperlipidemia Assessment & Plan: Cholesterol levels were better with last check, continue low fat diet.   Orders: -     Lipid panel  Morbid obesity with BMI of 45.0-49.9, adult (HCC) Assessment & Plan: Significant weight gain  of 50 pounds over the past year. Previous success with intermittent fasting and exercise. Discussed potential new oral agent, Wegovy , which may be available soon and is cash pay at $140-$150 with a coupon. - Encouraged resumption of intermittent fasting and regular exercise, aiming for five days a week. - Will check availability of new oral agent, Wegovy , in a couple of weeks. - Scheduled follow-up in 8-10 weeks to monitor progress and discuss potential medication options.   Other long term (current) drug therapy -     CBC  Influenza vaccination declined  Encounter for screening colonoscopy Assessment & Plan: According to USPTF Colorectal cancer Screening guidelines. Colonoscopy is recommended every 10 years, starting at age 48 years. Will refer to GI for colon cancer screening.   Orders: -     Ambulatory referral to Gastroenterology     Return for 1 year physical; 8-10 week weight check . Patient was given opportunity to ask questions. Patient verbalized understanding of the plan and was able to repeat key elements of the plan. All questions were answered to their satisfaction.   Sheryl Ada, FNP  I, Sheryl Ada, FNP, have reviewed all documentation for this visit. The documentation on 09/08/2024 for the exam, diagnosis, procedures, and orders are all accurate and complete.      [1]  Current Outpatient Medications:    olmesartan  (BENICAR ) 20 MG tablet, Take 1 tablet by mouth daily., Disp: 90 tablet, Rfl: 0   rosuvastatin  (CRESTOR ) 10 MG tablet, Take 1 tablet (10 mg total) by mouth daily. Must keep cardiology appointment in January 2026 for future refills., Disp: 45 tablet, Rfl: 0 [2] No Known Allergies [3]  Social History Tobacco Use  Smoking Status Never  Smokeless Tobacco Never

## 2024-09-08 NOTE — Assessment & Plan Note (Signed)
 According to USPTF Colorectal cancer Screening guidelines. Colonoscopy is recommended every 10 years, starting at age 46 years. Will refer to GI for colon cancer screening.

## 2024-09-09 ENCOUNTER — Other Ambulatory Visit: Payer: Self-pay | Admitting: Cardiology

## 2024-09-09 LAB — CMP14+EGFR
ALT: 29 IU/L (ref 0–32)
AST: 30 IU/L (ref 0–40)
Albumin: 4.2 g/dL (ref 3.9–4.9)
Alkaline Phosphatase: 85 IU/L (ref 41–116)
BUN/Creatinine Ratio: 13 (ref 9–23)
BUN: 8 mg/dL (ref 6–24)
Bilirubin Total: 0.5 mg/dL (ref 0.0–1.2)
CO2: 20 mmol/L (ref 20–29)
Calcium: 10.4 mg/dL — ABNORMAL HIGH (ref 8.7–10.2)
Chloride: 101 mmol/L (ref 96–106)
Creatinine, Ser: 0.64 mg/dL (ref 0.57–1.00)
Globulin, Total: 3.1 g/dL (ref 1.5–4.5)
Glucose: 76 mg/dL (ref 70–99)
Potassium: 4.8 mmol/L (ref 3.5–5.2)
Sodium: 135 mmol/L (ref 134–144)
Total Protein: 7.3 g/dL (ref 6.0–8.5)
eGFR: 111 mL/min/1.73

## 2024-09-09 LAB — CBC
Hematocrit: 40.1 % (ref 34.0–46.6)
Hemoglobin: 12.8 g/dL (ref 11.1–15.9)
MCH: 27.7 pg (ref 26.6–33.0)
MCHC: 31.9 g/dL (ref 31.5–35.7)
MCV: 87 fL (ref 79–97)
Platelets: 361 x10E3/uL (ref 150–450)
RBC: 4.62 x10E6/uL (ref 3.77–5.28)
RDW: 13.3 % (ref 11.7–15.4)
WBC: 9.6 x10E3/uL (ref 3.4–10.8)

## 2024-09-09 LAB — HEMOGLOBIN A1C
Est. average glucose Bld gHb Est-mCnc: 114 mg/dL
Hgb A1c MFr Bld: 5.6 % (ref 4.8–5.6)

## 2024-09-09 LAB — LIPID PANEL
Chol/HDL Ratio: 2.8 ratio (ref 0.0–4.4)
Cholesterol, Total: 190 mg/dL (ref 100–199)
HDL: 69 mg/dL
LDL Chol Calc (NIH): 109 mg/dL — ABNORMAL HIGH (ref 0–99)
Triglycerides: 64 mg/dL (ref 0–149)
VLDL Cholesterol Cal: 12 mg/dL (ref 5–40)

## 2024-09-09 LAB — MICROALBUMIN / CREATININE URINE RATIO
Creatinine, Urine: 11.9 mg/dL
Microalb/Creat Ratio: <25 mg/g{creat} (ref 0–29)
Microalbumin, Urine: <3 ug/mL

## 2024-09-15 NOTE — Progress Notes (Deleted)
" °  Cardiology Office Note   Date:  09/15/2024  ID:  Sheryl Garcia, Sheryl Garcia 1980-12-11, MRN [id number] PCP: Georgina Speaks, FNP  [facility] Providers Cardiologist:  Dub Huntsman, DO { Click to update primary MD,subspecialty MD or APP then REFRESH:1}    History of Present Illness Sheryl Garcia is a 44 y.o. female with a past medical history of paroxysmal supraventricular tachycardia, HTN. Patient presents toay for an annual follow up appointment   Patient previously had coronary calcium  scoring in 12/2022 that showed a coronary calcium  score of 0. Cardiac monitor at that time showed predominantly sinus rhythm, paroxysmal supraventricular tachycardia. There were episodes of wenckebach overnight.   HTN  -  - continue olmesartan  20 mg daily  - K 4.8 and creatinine 0.64 earlier this month   Paroxysmal supraventricular tachycardia  - Noted on cardiac monitor in 12/2022 -  HLD  - Lipid panel 09/2024 showed LDL 109  - Continue crestor  10 mg daily   ROS: ***  Studies Reviewed      *** Risk Assessment/Calculations {Does this patient have ATRIAL FIBRILLATION?:[phone]} No BP recorded.  {Refresh Note OR Click here to enter BP  :1}***       Physical Exam VS:  LMP 08/07/2024        Wt Readings from Last 3 Encounters:  09/08/24 282 lb (127.9 kg)  09/03/23 235 lb 6.4 oz (106.8 kg)  05/14/23 221 lb (100.2 kg)    GEN: Well nourished, well developed in no acute distress NECK: No JVD; No carotid bruits CARDIAC: ***RRR, no murmurs, rubs, gallops RESPIRATORY:  Clear to auscultation without rales, wheezing or rhonchi  ABDOMEN: Soft, non-tender, non-distended EXTREMITIES:  No edema; No deformity   ASSESSMENT AND PLAN ***    {Are you ordering a CV Procedure (e.g. stress test, cath, DCCV, TEE, etc)?   Press F2        :[id number]}  Dispo: ***  Signed, Rollo FABIENE Louder, PA-C   "

## 2024-09-17 ENCOUNTER — Ambulatory Visit: Payer: Self-pay | Admitting: Nurse Practitioner

## 2024-09-17 ENCOUNTER — Other Ambulatory Visit: Payer: Self-pay | Admitting: Nurse Practitioner

## 2024-09-17 ENCOUNTER — Encounter: Payer: Self-pay | Admitting: Nurse Practitioner

## 2024-09-17 MED ORDER — WEGOVY 1.5 MG PO TABS: 1.5000 mg | ORAL_TABLET | Freq: Every day | ORAL | 0 refills | Status: AC

## 2024-09-29 ENCOUNTER — Ambulatory Visit

## 2024-09-29 VITALS — BP 122/76 | HR 92 | Resp 12 | Ht 66.0 in | Wt 281.0 lb

## 2024-09-29 DIAGNOSIS — I10 Essential (primary) hypertension: Secondary | ICD-10-CM

## 2024-09-29 DIAGNOSIS — G4730 Sleep apnea, unspecified: Secondary | ICD-10-CM

## 2024-09-29 DIAGNOSIS — E782 Mixed hyperlipidemia: Secondary | ICD-10-CM

## 2024-09-29 NOTE — Progress Notes (Signed)
 " Cardiology Office Note   Date:  09/29/2024  ID:  Sheryl, Garcia [birth year], MRN [id number] PCP: Georgina Speaks, FNP  [facility] Providers Cardiologist:  Dub Huntsman, DO     History of Present Illness AEMILIA Garcia is a 44 y.o. female with history of hypertension, hyperlipidemia, prediabetes, and obesity.    She initially establish care with Dr. Huntsman 12/2022 in the setting of abnormal EKG at her PCP office showing marked bradycardia.  Coronary calcium  score 12/07/2022 showed coronary calcium  score of 0 and aortic atherosclerosis near the ostium of the RCA.  She was started on rosuvastatin  10 mg.  She wore a ZIO monitor in the setting of bradycardia for 10 days with results of predominant underlying rhythm of SR, 1 SVT run lasting 20.8 seconds with max heart rate of 197 bpm, second-degree AV block-Mobitz 1 occurring during overnight hours suggesting sleep apnea, and isolated PVCs and PACs. She agreed to a sleep study however never completed.  She was last seen in office 05/14/2023 by Dr. Huntsman in the setting of hypertension.  Her blood pressure was well-controlled in office.  No medication changes made.  Overall stable from a cardiac standpoint.    She presents today for overdue follow-up in the setting of hypertension.  She works at [employer]. She continues to do well and is currently trying to lose weight.  She has been working out more and has noticed some shortness of breath. She reports this is the same shortness of breath she had before when she was beginning her weight loss journey.  She is able to complete her daily activities without shortness of breath.  She takes her blood pressure around twice a week at work with SBP 120-130.  She noted she did not get a call about her previous sleep study order. She denies chest pain, lower extremity edema, fatigue, palpitations, melena, hematuria, hemoptysis, diaphoresis, weakness, presyncope, syncope, orthopnea, and PND.  ROS:  All systems negative unless otherwise indicated in HPI.  Studies Reviewed EKG Interpretation Date/Time:  Tuesday September 29 2024 15:28:38 EST Ventricular Rate:  83 PR Interval:  166 QRS Duration:  84 QT Interval:  356 QTC Calculation: 418 R Axis:   68  Text Interpretation: Normal sinus rhythm Nonspecific ST abnormality When compared with ECG of 02-Sep-2021 11:25, PREVIOUS ECG IS PRESENT Confirmed by Teresa Fish [phone]) on 09/29/2024 3:34:40 PM    Cardiac Studies & Procedures   ______________________________________________________________________________________________        SHERRILEE  LONG TERM MONITOR (3-14 DAYS) 12/31/2022  Narrative Patch Wear Time:  10 days and 10 hours (2024-04-07T12:08:22-0400 to 2024-04-17T22:38:11-398)  Patient had a min HR of 32 bpm, max HR of 197 bpm, and avg HR of 77 bpm. Predominant underlying rhythm was Sinus Rhythm. 1 run of Supraventricular Tachycardia occurred lasting 20.8 secs with a max rate of 197 bpm (avg 136 bpm). Second Degree AV Block-Mobitz I (Wenckebach) was present. Isolated SVEs were rare (<1.0%), SVE Couplets were rare (<1.0%), and SVE Triplets were rare (<1.0%). Isolated VEs were rare (<1.0%), and no VE Couplets or VE Triplets were present.  Symptoms associated with sinus rhythm.  Conclusion: This study showed evidence of the following: 1. Paroxysmal Supraventricular tachycardia 2. Second Degree AV Block-Mobitz I (Wenckebach) - occurred during the overnight hrs suggesting Sleep apnea.   CT SCANS  CT CARDIAC SCORING (SELF PAY ONLY) 12/07/2022  Addendum 12/07/2022 10:54 PM ADDENDUM REPORT: 12/07/2022 22:51  EXAM: OVER-READ INTERPRETATION  CT CHEST  The following report is  an over-read performed by radiologist Dr. Fonda [facility] on 12/07/2022. This over-read does not include interpretation of cardiac or coronary anatomy or pathology. The coronary calcium  score interpretation by the cardiologist is  attached.  COMPARISON:  07/31/2007  FINDINGS: Cardiovascular: No significant cardiovascular abnormalities identified without contrast.  Mediastinum/Nodes: No suspicious adenopathy identified. Imaged mediastinal structures are unremarkable.  Lungs/Pleura: Imaged lungs are clear. No pleural effusion or pneumothorax.  Upper Abdomen: No acute abnormality.  Musculoskeletal: No chest wall abnormality. No acute or significant osseous findings.  IMPRESSION: No significant extracardiac incidental findings identified.   Electronically Signed By: Fonda Field M.D. On: 12/07/2022 22:51  Narrative CLINICAL DATA:  Cardiovascular Disease Risk stratification  EXAM: Coronary Calcium  Score  TECHNIQUE: A gated, non-contrast computed tomography scan of the heart was performed using 3 mm slice thickness. Axial images were analyzed on a dedicated workstation. Calcium  scoring of the coronary arteries was performed using the Agatston method.  FINDINGS: Coronary arteries: Normal origins.  Coronary Calcium  Score:  Left main: 0  Left anterior descending artery: 0  Left circumflex artery: 0  Right coronary artery: 0  Total: 0  Percentile: 0  Pericardium: Normal.  Aorta: Normal caliber. Aortic atherosclerosis near the ostium of the RCA.  Non-cardiac: See separate report from [facility].  IMPRESSION: 1. Coronary calcium  score of 0. 2. Aortic atherosclerosis near the ostium of the RCA.  RECOMMENDATIONS: Coronary artery calcium  (CAC) score is a strong predictor of incident coronary heart disease (CHD) and provides predictive information beyond traditional risk factors. CAC scoring is reasonable to use in the decision to withhold, postpone, or initiate statin therapy in intermediate-risk or selected borderline-risk asymptomatic adults (age 50-75 years and LDL-C >=70 to <190 mg/dL) who do not have diabetes or established atherosclerotic cardiovascular disease  (ASCVD).* In intermediate-risk (10-year ASCVD risk >=7.5% to <20%) adults or selected borderline-risk (10-year ASCVD risk >=5% to <7.5%) adults in whom a CAC score is measured for the purpose of making a treatment decision the following recommendations have been made:  If CAC=0, it is reasonable to withhold statin therapy and reassess in 5 to 10 years, as long as higher risk conditions are absent (diabetes mellitus, family history of premature CHD in first degree relatives (males <55 years; females <65 years), cigarette smoking, or LDL >=190 mg/dL).  If CAC is 1 to 99, it is reasonable to initiate statin therapy for patients >=76 years of age.  If CAC is >=100 or >=75th percentile, it is reasonable to initiate statin therapy at any age.  Cardiology referral should be considered for patients with CAC scores >=400 or >=75th percentile.  *2018 AHA/ACC/AACVPR/AAPA/ABC/ACPM/ADA/AGS/APhA/ASPC/NLA/PCNA Guideline on the Management of Blood Cholesterol: A Report of the American College of Cardiology/American Heart Association Task Force on Clinical Practice Guidelines. J Am Coll Cardiol. 2019;73(24):3168-3209.  Vinie Maxcy, MD  Electronically Signed: By: Vinie JAYSON Maxcy M.D. On: 12/07/2022 16:54     ______________________________________________________________________________________________      Risk Assessment/Calculations           Physical Exam VS:  BP 122/76 (BP Location: Left Wrist, Patient Position: Sitting, Cuff Size: Large)   Pulse 92   Resp 12   Ht 5' 6 (1.676 m)   Wt 281 lb (127.5 kg)   LMP 08/07/2024   SpO2 98%   BMI 45.35 kg/m        Wt Readings from Last 3 Encounters:  09/29/24 281 lb (127.5 kg)  09/08/24 282 lb (127.9 kg)  09/03/23 235 lb 6.4 oz (106.8  kg)    GEN: Well nourished, well developed in no acute distress NECK: No JVD; No carotid bruits CARDIAC: RRR, no murmurs, rubs, gallops RESPIRATORY:  Clear to auscultation without rales, wheezing  or rhonchi  ABDOMEN: Soft, non-tender, non-distended EXTREMITIES:  No edema; No deformity   ASSESSMENT AND PLAN  Hypertension- BP today 122/76.  When she checks her BP at work twice weekly she notes SBP 120-130. -EKG today NSR. -Discussed to monitor BP twice weekly at least 2 hours after medications and sitting for 5-10 minutes. -Continue olmesartan  20 mg.   Hyperlipidemia- Last LDL109 on 09/08/2024.  -Heart healthy diet and regular cardiovascular exercise encouraged.  -Calcium  score of 0 in 2024.  -Discussed increasing rosuvastatin  today.  She defers at this time as she is working on weight loss and increased exercise. -Continue rosuvastatin  10 mg.   Sleep apnea- Sleep study ordered in 2024 after ZIO monitor results of second-degree AV block noted during sleeping hours suggesting OSA.  Study never performed. -She does report today that she wakes herself up snoring and she feels fatigue and not well rested in the morning. - At home sleep study ordered.  Obesity-She is currently increasing her exercise and eating a heart healthy diet.  She notes shortness of breath when working out.  She reports this is lower to the shortness of breath she had before when increasing her exercise to lose weight. - Continue Wegovy  1.5 mg po.       Dispo: Follow-up with Dr. Sheena in 1 year.  Signed, Mardy KATHEE Pizza, FNP  "

## 2024-09-29 NOTE — Patient Instructions (Signed)
 Medication Instructions:  Your physician recommends that you continue on your current medications as directed. Please refer to the Current Medication list given to you today.  *If you need a refill on your cardiac medications before your next appointment, please call your pharmacy*  Lab Work: Lab Orders  No laboratory test(s) ordered today    If you have labs (blood work) drawn today and your tests are completely normal, you will receive your results only by: MyChart Message (if you have MyChart) OR A paper copy in the mail If you have any lab test that is abnormal or we need to change your treatment, we will call you to review the results.  Follow-Up: At [facility], you and your health needs are our priority.  As part of our continuing mission to provide you with exceptional heart care, our providers are all part of one team.  This team includes your primary Cardiologist (physician) and Advanced Practice Providers or APPs (Physician Assistants and Nurse Practitioners) who all work together to provide you with the care you need, when you need it.  Your next appointment:   1 year(s)  Provider:   Kardie Tobb, DO    We recommend signing up for the patient portal called MyChart.  Sign up information is provided on this After Visit Summary.  MyChart is used to connect with patients for Virtual Visits (Telemedicine).  Patients are able to view lab/test results, encounter notes, upcoming appointments, etc.  Non-urgent messages can be sent to your provider as well.   To learn more about what you can do with MyChart, go to [email or web address].   Other Instructions:  HOME SLEEP STUDY  Your physician has recommended that you have a sleep study. This test records several body functions during sleep, including: brain activity, eye movement, oxygen and carbon dioxide blood levels, heart rate and rhythm, breathing rate and rhythm, the flow of air through your mouth and nose, snoring,  body muscle movements, and chest and belly movement.

## 2024-10-07 ENCOUNTER — Encounter: Payer: Self-pay | Admitting: Nurse Practitioner

## 2024-11-30 ENCOUNTER — Ambulatory Visit: Payer: Self-pay | Admitting: Nurse Practitioner

## 2025-09-09 ENCOUNTER — Encounter: Payer: Self-pay | Admitting: Nurse Practitioner
